# Patient Record
Sex: Male | Born: 1995 | Hispanic: No | Marital: Single | Smoking: Never smoker
Health system: Southern US, Community
[De-identification: ages and names within clinical notes are randomized; demographics above are authoritative.]

## PROBLEM LIST (undated history)

## (undated) DIAGNOSIS — F1910 Other psychoactive substance abuse, uncomplicated: Secondary | ICD-10-CM

## (undated) DIAGNOSIS — F32A Depression, unspecified: Secondary | ICD-10-CM

## (undated) DIAGNOSIS — F329 Major depressive disorder, single episode, unspecified: Secondary | ICD-10-CM

---
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
## 2017-02-04 ENCOUNTER — Encounter (HOSPITAL_COMMUNITY): Payer: Self-pay | Admitting: Nurse Practitioner

## 2017-02-04 DIAGNOSIS — Y93G1 Activity, food preparation and clean up: Secondary | ICD-10-CM | POA: Insufficient documentation

## 2017-02-04 DIAGNOSIS — S61411A Laceration without foreign body of right hand, initial encounter: Secondary | ICD-10-CM | POA: Insufficient documentation

## 2017-02-04 DIAGNOSIS — W260XXA Contact with knife, initial encounter: Secondary | ICD-10-CM | POA: Diagnosis not present

## 2017-02-04 DIAGNOSIS — S6981XA Other specified injuries of right wrist, hand and finger(s), initial encounter: Secondary | ICD-10-CM | POA: Diagnosis present

## 2017-02-04 DIAGNOSIS — Z79899 Other long term (current) drug therapy: Secondary | ICD-10-CM | POA: Insufficient documentation

## 2017-02-04 DIAGNOSIS — Y929 Unspecified place or not applicable: Secondary | ICD-10-CM | POA: Diagnosis not present

## 2017-02-04 DIAGNOSIS — Y999 Unspecified external cause status: Secondary | ICD-10-CM | POA: Insufficient documentation
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
## 2017-02-05 ENCOUNTER — Emergency Department (HOSPITAL_COMMUNITY)
Admission: EM | Admit: 2017-02-05 | Discharge: 2017-02-05 | Disposition: A | Discharge status: Home / Self Care | Payer: Medicaid Other | Attending: Emergency Medicine | Admitting: Emergency Medicine

## 2017-02-05 DIAGNOSIS — S61411A Laceration without foreign body of right hand, initial encounter: Secondary | ICD-10-CM

## 2017-02-05 MED ORDER — BACITRACIN ZINC 500 UNIT/GM EX OINT
TOPICAL_OINTMENT | CUTANEOUS | Status: AC
  Administered 2017-02-05: 1
  Filled 2017-02-05: qty 0.9

## 2017-02-05 MED ORDER — LIDOCAINE-EPINEPHRINE (PF) 2 %-1:200000 IJ SOLN
10.0000 mL | Freq: Once | INTRAMUSCULAR | Status: AC
  Administered 2017-02-05: 10 mL
  Filled 2017-02-05: qty 20

## 2017-02-05 NOTE — ED Provider Notes (Signed)
[facility]-EMERGENCY DEPT Provider Note   CSN: [id number] Arrival date & time: 02/04/17  2120     History   Chief Complaint No chief complaint on file.   HPI Jeremy Short is a 21 y.o. male.  Patient presents to the emergency department with chief complaint of hand laceration.  He states that he was cutting vegetables this evening cut his right hand.  He states that the pain is worsened with palpation.  He denies any difficulty moving his fingers.  He states his tetanus shot is up to date.  He denies any numbness, weakness, or tingling.   The history is provided by the patient. No language interpreter was used.    History reviewed. No pertinent past medical history.  There are no active problems to display for this patient.   History reviewed. No pertinent surgical history.     Home Medications    Prior to Admission medications   Medication Sig Start Date End Date Taking? Authorizing Provider  ALPRAZolam (XANAX PO) Take 1 tablet by mouth once.    [provider]    Family History History reviewed. No pertinent family history.  Social History Social History   Tobacco Use  . Smoking status: Never Smoker  . Smokeless tobacco: Never Used  Substance Use Topics  . Alcohol use: No    Frequency: Never  . Drug use: Not on file     Allergies   Patient has no known allergies.   Review of Systems Review of Systems  All other systems reviewed and are negative.    Physical Exam Updated Vital Signs BP 99/64 (BP Location: Left Arm)   Pulse (!) 119   Temp 97.9 F (36.6 C) (Oral)   Resp 18   SpO2 99%   Physical Exam Nursing note and vitals reviewed.  Constitutional: Pt appears well-developed and well-nourished. No distress.  HENT:  Head: Normocephalic and atraumatic.  Eyes: Conjunctivae are normal.  Neck: Normal range of motion.  Cardiovascular: Normal rate, regular rhythm. Intact distal pulses.   Capillary refill <  3 sec.  Pulmonary/Chest: Effort normal and breath sounds normal.  Musculoskeletal:  Right hand Pt exhibits shallow 2 cm linear laceration to the right hand over the 2nd MCP .   ROM: 5/5  Strength: 5/5  Neurological: Pt  is alert. Coordination normal.  Sensation: 5/5 Skin: Skin is warm and dry. Pt is not diaphoretic.  See in msk Psychiatric: Pt has a normal mood and affect.   \  ED Treatments / Results  Labs (all labs ordered are listed, but only abnormal results are displayed) Labs Reviewed - No data to display  EKG  EKG Interpretation None       Radiology No results found.  Procedures Procedures (including critical care time) LACERATION REPAIR Performed by: Roxy HorsemanBROWNING, Zakyra Kukuk Authorized by: Roxy HorsemanBROWNING, Alaiyah Bollman Consent: Verbal consent obtained. Risks and benefits: risks, benefits and alternatives were discussed Consent given by: patient Patient identity confirmed: provided demographic data Prepped and Draped in normal sterile fashion Wound explored  Laceration Location: right hand  Laceration Length: 2cm  No Foreign Bodies seen or palpated  Anesthesia: local infiltration  Local anesthetic: lidocaine 1% with epinephrine  Anesthetic total: 4 ml  Irrigation method: syringe Amount of cleaning: standard  Skin closure: 4-0 prolene  Number of sutures: 5  Technique: interrupted  Patient tolerance: Patient tolerated the procedure well with no immediate complications.  Medications Ordered in ED Medications  lidocaine-EPINEPHrine (XYLOCAINE W/EPI) 2 %-1:200000 (PF) injection 10 mL (  not administered)     Initial Impression / Assessment and Plan / ED Course  I have reviewed the triage vital signs and the nursing notes.  Pertinent labs & imaging results that were available during my care of the patient were reviewed by me and considered in my medical decision making (see chart for details).     Patient with simple hand laceration.  Tdap is current. PCP  follow-up.  Final Clinical Impressions(s) / ED Diagnoses   Final diagnoses:  Laceration of right hand without foreign body, initial encounter    ED Discharge Orders    None       Roxy HorsemanBrowning, Melisse Caetano, PA-C 02/05/17 0145    Melene PlanFloyd, Dan, DO 02/05/17 [phone]

## 2017-07-01 ENCOUNTER — Encounter (HOSPITAL_COMMUNITY): Payer: Self-pay | Admitting: Nurse Practitioner

## 2017-07-01 ENCOUNTER — Encounter (HOSPITAL_COMMUNITY): Payer: Self-pay | Admitting: *Deleted

## 2017-07-01 ENCOUNTER — Emergency Department (HOSPITAL_COMMUNITY): Payer: Medicaid Other

## 2017-07-01 ENCOUNTER — Observation Stay (HOSPITAL_COMMUNITY)
Admission: EM | Admit: 2017-07-01 | Discharge: 2017-07-01 | Disposition: A | Discharge status: Home / Self Care | Payer: Medicaid Other | Attending: General Surgery | Admitting: General Surgery

## 2017-07-01 ENCOUNTER — Observation Stay (HOSPITAL_COMMUNITY): Payer: Medicaid Other

## 2017-07-01 ENCOUNTER — Other Ambulatory Visit: Payer: Self-pay

## 2017-07-01 DIAGNOSIS — S31139A Puncture wound of abdominal wall without foreign body, unspecified quadrant without penetration into peritoneal cavity, initial encounter: Secondary | ICD-10-CM | POA: Insufficient documentation

## 2017-07-01 DIAGNOSIS — S32009B Unspecified fracture of unspecified lumbar vertebra, initial encounter for open fracture: Secondary | ICD-10-CM | POA: Diagnosis not present

## 2017-07-01 DIAGNOSIS — S61432A Puncture wound without foreign body of left hand, initial encounter: Secondary | ICD-10-CM | POA: Diagnosis not present

## 2017-07-01 DIAGNOSIS — S0183XA Puncture wound without foreign body of other part of head, initial encounter: Secondary | ICD-10-CM | POA: Insufficient documentation

## 2017-07-01 DIAGNOSIS — S71032A Puncture wound without foreign body, left hip, initial encounter: Secondary | ICD-10-CM | POA: Diagnosis not present

## 2017-07-01 DIAGNOSIS — S81032A Puncture wound without foreign body, left knee, initial encounter: Secondary | ICD-10-CM | POA: Insufficient documentation

## 2017-07-01 DIAGNOSIS — W3400XA Accidental discharge from unspecified firearms or gun, initial encounter: Secondary | ICD-10-CM

## 2017-07-01 DIAGNOSIS — S81832A Puncture wound without foreign body, left lower leg, initial encounter: Secondary | ICD-10-CM | POA: Diagnosis present

## 2017-07-01 HISTORY — DX: Depression, unspecified: F32.A

## 2017-07-01 HISTORY — DX: Major depressive disorder, single episode, unspecified: F32.9

## 2017-07-01 LAB — PREPARE FRESH FROZEN PLASMA
Unit division: 0
Unit division: 0

## 2017-07-01 LAB — CBC
HCT: 41.5 % (ref 39.0–52.0)
HEMOGLOBIN: 14 g/dL (ref 13.0–17.0)
MCH: 30.4 pg (ref 26.0–34.0)
MCHC: 33.7 g/dL (ref 30.0–36.0)
MCV: 90 fL (ref 78.0–100.0)
Platelets: 156 10*3/uL (ref 150–400)
RBC: 4.61 MIL/uL (ref 4.22–5.81)
RDW: 12.9 % (ref 11.5–15.5)
WBC: 11.9 10*3/uL — ABNORMAL HIGH (ref 4.0–10.5)

## 2017-07-01 LAB — BPAM RBC
BLOOD PRODUCT EXPIRATION DATE: 201904302359
BLOOD PRODUCT EXPIRATION DATE: 201905042359
ISSUE DATE / TIME: 201904120037
ISSUE DATE / TIME: 201904120037
UNIT TYPE AND RH: 9500
Unit Type and Rh: 9500

## 2017-07-01 LAB — BPAM FFP
Blood Product Expiration Date: 201904122359
Blood Product Expiration Date: 201904132359
ISSUE DATE / TIME: 201904120038
ISSUE DATE / TIME: 201904120038
Unit Type and Rh: 6200
Unit Type and Rh: 6200

## 2017-07-01 LAB — COMPREHENSIVE METABOLIC PANEL
ALK PHOS: 64 U/L (ref 38–126)
ALT: 26 U/L (ref 17–63)
AST: 24 U/L (ref 15–41)
Albumin: 3.9 g/dL (ref 3.5–5.0)
Anion gap: 12 (ref 5–15)
BUN: 11 mg/dL (ref 6–20)
CALCIUM: 8.8 mg/dL — AB (ref 8.9–10.3)
CO2: 21 mmol/L — AB (ref 22–32)
Chloride: 105 mmol/L (ref 101–111)
Creatinine, Ser: 1.01 mg/dL (ref 0.61–1.24)
GFR calc Af Amer: >60 mL/min (ref >60)
GFR calc non Af Amer: >60 mL/min (ref >60)
Glucose, Bld: 108 mg/dL — ABNORMAL HIGH (ref 65–99)
Potassium: 3.2 mmol/L — ABNORMAL LOW (ref 3.5–5.1)
SODIUM: 138 mmol/L (ref 135–145)
Total Bilirubin: 0.5 mg/dL (ref 0.3–1.2)
Total Protein: 6.4 g/dL — ABNORMAL LOW (ref 6.5–8.1)

## 2017-07-01 LAB — TYPE AND SCREEN
ABO/RH(D): O POS
ANTIBODY SCREEN: NEGATIVE
Unit division: 0
Unit division: 0

## 2017-07-01 LAB — I-STAT CHEM 8, ED
BUN: 11 mg/dL (ref 6–20)
CHLORIDE: 104 mmol/L (ref 101–111)
Calcium, Ion: 1.02 mmol/L — ABNORMAL LOW (ref 1.15–1.40)
Creatinine, Ser: 1 mg/dL (ref 0.61–1.24)
Glucose, Bld: 103 mg/dL — ABNORMAL HIGH (ref 65–99)
HCT: 41 % (ref 39.0–52.0)
Hemoglobin: 13.9 g/dL (ref 13.0–17.0)
Potassium: 3.2 mmol/L — ABNORMAL LOW (ref 3.5–5.1)
Sodium: 140 mmol/L (ref 135–145)
TCO2: 24 mmol/L (ref 22–32)

## 2017-07-01 LAB — PROTIME-INR
INR: 1.09
Prothrombin Time: 14 seconds (ref 11.4–15.2)

## 2017-07-01 LAB — CDS SEROLOGY

## 2017-07-01 LAB — ETHANOL: ALCOHOL ETHYL (B): 36 mg/dL — AB (ref <10)

## 2017-07-01 LAB — URINALYSIS, ROUTINE W REFLEX MICROSCOPIC
Bilirubin Urine: NEGATIVE
GLUCOSE, UA: NEGATIVE mg/dL
HGB URINE DIPSTICK: NEGATIVE
Ketones, ur: NEGATIVE mg/dL
Leukocytes, UA: NEGATIVE
Nitrite: NEGATIVE
PROTEIN: NEGATIVE mg/dL
Specific Gravity, Urine: 1.041 — ABNORMAL HIGH (ref 1.005–1.030)
pH: 7 (ref 5.0–8.0)

## 2017-07-01 LAB — BLOOD PRODUCT ORDER (VERBAL) VERIFICATION

## 2017-07-01 LAB — I-STAT CG4 LACTIC ACID, ED: Lactic Acid, Venous: 2.52 mmol/L (ref 0.5–1.9)

## 2017-07-01 LAB — HIV ANTIBODY (ROUTINE TESTING W REFLEX): HIV Screen 4th Generation wRfx: NONREACTIVE

## 2017-07-01 LAB — ABO/RH: ABO/RH(D): O POS

## 2017-07-01 MED ORDER — ACETAMINOPHEN 325 MG PO TABS: 650.0000 mg | ORAL_TABLET | Freq: Four times a day (QID) | ORAL | Status: DC

## 2017-07-01 MED ORDER — BACITRACIN ZINC 500 UNIT/GM EX OINT
TOPICAL_OINTMENT | Freq: Two times a day (BID) | CUTANEOUS | Status: DC
  Administered 2017-07-01: 03:00:00 via TOPICAL
  Filled 2017-07-01: qty 28.35

## 2017-07-01 MED ORDER — ACETAMINOPHEN 325 MG PO TABS: 650.0000 mg | ORAL_TABLET | ORAL | Status: DC | PRN

## 2017-07-01 MED ORDER — ONDANSETRON 4 MG PO TBDP: 4.0000 mg | ORAL_TABLET | Freq: Four times a day (QID) | ORAL | Status: DC | PRN

## 2017-07-01 MED ORDER — METHOCARBAMOL 500 MG PO TABS: 500.0000 mg | ORAL_TABLET | Freq: Three times a day (TID) | ORAL | 0 refills | Status: DC | PRN

## 2017-07-01 MED ORDER — IBUPROFEN 400 MG PO TABS: 400.0000 mg | ORAL_TABLET | Freq: Three times a day (TID) | ORAL | 0 refills | Status: DC

## 2017-07-01 MED ORDER — OXYCODONE HCL 5 MG PO TABS: 5.0000 mg | ORAL_TABLET | ORAL | Status: DC | PRN

## 2017-07-01 MED ORDER — HYDROMORPHONE HCL 1 MG/ML IJ SOLN
1.0000 mg | INTRAMUSCULAR | Status: DC | PRN
Start: 2017-07-01 — End: 2017-07-01
  Administered 2017-07-01 (×2): 1 mg via INTRAVENOUS
  Filled 2017-07-01 (×2): qty 1

## 2017-07-01 MED ORDER — ACETAMINOPHEN 325 MG PO TABS
650.0000 mg | ORAL_TABLET | Freq: Four times a day (QID) | ORAL | Status: DC
  Filled 2017-07-01: qty 2

## 2017-07-01 MED ORDER — NICOTINE 21 MG/24HR TD PT24
21.0000 mg | MEDICATED_PATCH | Freq: Every day | TRANSDERMAL | Status: DC
  Administered 2017-07-01: 21 mg via TRANSDERMAL
  Filled 2017-07-01: qty 1

## 2017-07-01 MED ORDER — OXYCODONE HCL 5 MG PO TABS
10.0000 mg | ORAL_TABLET | ORAL | Status: DC | PRN
  Administered 2017-07-01 (×2): 10 mg via ORAL
  Filled 2017-07-01 (×2): qty 2

## 2017-07-01 MED ORDER — BACITRACIN ZINC 500 UNIT/GM EX OINT: TOPICAL_OINTMENT | Freq: Two times a day (BID) | CUTANEOUS | 0 refills | Status: DC

## 2017-07-01 MED ORDER — METHOCARBAMOL 500 MG PO TABS
500.0000 mg | ORAL_TABLET | Freq: Three times a day (TID) | ORAL | Status: DC | PRN
  Administered 2017-07-01: 500 mg via ORAL
  Filled 2017-07-01: qty 1

## 2017-07-01 MED ORDER — IOPAMIDOL (ISOVUE-300) INJECTION 61%
100.0000 mL | Freq: Once | INTRAVENOUS | Status: AC | PRN
  Administered 2017-07-01: 100 mL via INTRAVENOUS

## 2017-07-01 MED ORDER — IOPAMIDOL (ISOVUE-300) INJECTION 61%
INTRAVENOUS | Status: AC
  Filled 2017-07-01: qty 100

## 2017-07-01 MED ORDER — ENOXAPARIN SODIUM 40 MG/0.4ML ~~LOC~~ SOLN
40.0000 mg | SUBCUTANEOUS | Status: DC
  Administered 2017-07-01: 40 mg via SUBCUTANEOUS
  Filled 2017-07-01: qty 0.4

## 2017-07-01 MED ORDER — OXYCODONE HCL 5 MG PO TABS: 5.0000 mg | ORAL_TABLET | ORAL | 0 refills | Status: DC | PRN

## 2017-07-01 MED ORDER — ONDANSETRON HCL 4 MG/2ML IJ SOLN: 4.0000 mg | Freq: Four times a day (QID) | INTRAMUSCULAR | Status: DC | PRN

## 2017-07-01 MED ORDER — FENTANYL CITRATE (PF) 100 MCG/2ML IJ SOLN
50.0000 ug | Freq: Once | INTRAMUSCULAR | Status: AC
  Administered 2017-07-01: 50 ug via INTRAVENOUS

## 2017-07-01 MED ORDER — KCL IN DEXTROSE-NACL 20-5-0.45 MEQ/L-%-% IV SOLN
INTRAVENOUS | Status: DC
  Administered 2017-07-01: 03:00:00 via INTRAVENOUS
  Filled 2017-07-01: qty 1000

## 2017-07-01 MED ORDER — IBUPROFEN 400 MG PO TABS: 400.0000 mg | ORAL_TABLET | Freq: Three times a day (TID) | ORAL | Status: DC
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
## 2017-07-01 NOTE — Progress Notes (Signed)
Pt for discharge going home, given health teachings, next appt, prescriptions, given pain meds as prescribed prior to discharge, discontinue peripheral IV line, he is asking about the silver with diamond watch in ID but according to our secretary Yolanda nothing in the ED, informed the pt . Given all his personal belongings in the room.

## 2017-07-01 NOTE — ED Notes (Signed)
[employer] at bedside speaking to patient
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
## 2017-07-01 NOTE — ED Notes (Signed)
Password of [phone] provided to patient and father. XXX policy explained to both.
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
## 2019-11-03 ENCOUNTER — Inpatient Hospital Stay (HOSPITAL_COMMUNITY)
Admission: EM | Admit: 2019-11-03 | Discharge: 2019-11-06 | DRG: 896 | Disposition: A | Discharge status: Home / Self Care | Payer: Medicaid Other | Attending: Internal Medicine | Admitting: Internal Medicine

## 2019-11-03 ENCOUNTER — Emergency Department (HOSPITAL_COMMUNITY): Payer: Medicaid Other

## 2019-11-03 ENCOUNTER — Other Ambulatory Visit: Payer: Self-pay

## 2019-11-03 ENCOUNTER — Encounter (HOSPITAL_COMMUNITY): Payer: Self-pay | Admitting: *Deleted

## 2019-11-03 DIAGNOSIS — F329 Major depressive disorder, single episode, unspecified: Secondary | ICD-10-CM | POA: Diagnosis present

## 2019-11-03 DIAGNOSIS — F13230 Sedative, hypnotic or anxiolytic dependence with withdrawal, uncomplicated: Principal | ICD-10-CM | POA: Diagnosis present

## 2019-11-03 DIAGNOSIS — F419 Anxiety disorder, unspecified: Secondary | ICD-10-CM | POA: Diagnosis present

## 2019-11-03 DIAGNOSIS — N179 Acute kidney failure, unspecified: Secondary | ICD-10-CM | POA: Diagnosis present

## 2019-11-03 DIAGNOSIS — Z79899 Other long term (current) drug therapy: Secondary | ICD-10-CM

## 2019-11-03 DIAGNOSIS — R739 Hyperglycemia, unspecified: Secondary | ICD-10-CM | POA: Diagnosis present

## 2019-11-03 DIAGNOSIS — E872 Acidosis, unspecified: Secondary | ICD-10-CM | POA: Diagnosis present

## 2019-11-03 DIAGNOSIS — D649 Anemia, unspecified: Secondary | ICD-10-CM | POA: Diagnosis present

## 2019-11-03 DIAGNOSIS — R509 Fever, unspecified: Secondary | ICD-10-CM | POA: Diagnosis not present

## 2019-11-03 DIAGNOSIS — T424X6A Underdosing of benzodiazepines, initial encounter: Secondary | ICD-10-CM | POA: Diagnosis present

## 2019-11-03 DIAGNOSIS — G928 Other toxic encephalopathy: Secondary | ICD-10-CM | POA: Diagnosis present

## 2019-11-03 DIAGNOSIS — D72829 Elevated white blood cell count, unspecified: Secondary | ICD-10-CM | POA: Diagnosis present

## 2019-11-03 DIAGNOSIS — R079 Chest pain, unspecified: Secondary | ICD-10-CM

## 2019-11-03 DIAGNOSIS — R569 Unspecified convulsions: Secondary | ICD-10-CM

## 2019-11-03 DIAGNOSIS — Z91138 Patient's unintentional underdosing of medication regimen for other reason: Secondary | ICD-10-CM

## 2019-11-03 DIAGNOSIS — E8809 Other disorders of plasma-protein metabolism, not elsewhere classified: Secondary | ICD-10-CM | POA: Diagnosis not present

## 2019-11-03 DIAGNOSIS — G4089 Other seizures: Secondary | ICD-10-CM | POA: Diagnosis present

## 2019-11-03 DIAGNOSIS — Z20822 Contact with and (suspected) exposure to covid-19: Secondary | ICD-10-CM | POA: Diagnosis present

## 2019-11-03 DIAGNOSIS — G92 Toxic encephalopathy: Secondary | ICD-10-CM | POA: Diagnosis present

## 2019-11-03 DIAGNOSIS — E778 Other disorders of glycoprotein metabolism: Secondary | ICD-10-CM | POA: Diagnosis not present

## 2019-11-03 LAB — I-STAT VENOUS BLOOD GAS, ED
Acid-base deficit: 26 mmol/L — ABNORMAL HIGH (ref 0.0–2.0)
Bicarbonate: 4.5 mmol/L — ABNORMAL LOW (ref 20.0–28.0)
Calcium, Ion: 1.12 mmol/L — ABNORMAL LOW (ref 1.15–1.40)
HCT: 47 % (ref 39.0–52.0)
Hemoglobin: 16 g/dL (ref 13.0–17.0)
O2 Saturation: 97 %
Potassium: 4.3 mmol/L (ref 3.5–5.1)
Sodium: 142 mmol/L (ref 135–145)
TCO2: 5 mmol/L — ABNORMAL LOW (ref 22–32)
pCO2, Ven: 19 mmHg — CL (ref 44.0–60.0)
pH, Ven: 6.986 — CL (ref 7.250–7.430)
pO2, Ven: 129 mmHg — ABNORMAL HIGH (ref 32.0–45.0)

## 2019-11-03 LAB — PROTIME-INR
INR: 1.4 — ABNORMAL HIGH (ref 0.8–1.2)
Prothrombin Time: 17 seconds — ABNORMAL HIGH (ref 11.4–15.2)

## 2019-11-03 LAB — I-STAT CHEM 8, ED
BUN: 26 mg/dL — ABNORMAL HIGH (ref 6–20)
Calcium, Ion: 1.14 mmol/L — ABNORMAL LOW (ref 1.15–1.40)
Chloride: 109 mmol/L (ref 98–111)
Creatinine, Ser: 1.5 mg/dL — ABNORMAL HIGH (ref 0.61–1.24)
Glucose, Bld: 244 mg/dL — ABNORMAL HIGH (ref 70–99)
HCT: 47 % (ref 39.0–52.0)
Hemoglobin: 16 g/dL (ref 13.0–17.0)
Potassium: 4.3 mmol/L (ref 3.5–5.1)
Sodium: 143 mmol/L (ref 135–145)
TCO2: 6 mmol/L — ABNORMAL LOW (ref 22–32)

## 2019-11-03 MED ORDER — SODIUM BICARBONATE 8.4 % IV SOLN
50.0000 meq | Freq: Once | INTRAVENOUS | Status: AC
  Administered 2019-11-03: 50 meq via INTRAVENOUS
  Filled 2019-11-03: qty 50

## 2019-11-03 MED ORDER — STERILE WATER FOR INJECTION IV SOLN
INTRAVENOUS | Status: DC
  Filled 2019-11-03: qty 850

## 2019-11-03 MED ORDER — DEXAMETHASONE SODIUM PHOSPHATE 10 MG/ML IJ SOLN
10.0000 mg | Freq: Once | INTRAMUSCULAR | Status: AC
  Administered 2019-11-04: 10 mg via INTRAVENOUS
  Filled 2019-11-03: qty 1

## 2019-11-03 MED ORDER — VANCOMYCIN HCL IN DEXTROSE 1-5 GM/200ML-% IV SOLN
1000.0000 mg | Freq: Once | INTRAVENOUS | Status: AC
  Administered 2019-11-04: 1000 mg via INTRAVENOUS
  Filled 2019-11-03: qty 200

## 2019-11-03 MED ORDER — SODIUM CHLORIDE 0.9 % IV SOLN
2.0000 g | Freq: Once | INTRAVENOUS | Status: AC
  Administered 2019-11-04: 2 g via INTRAVENOUS
  Filled 2019-11-03: qty 20

## 2019-11-03 MED ORDER — LACTATED RINGERS IV BOLUS (SEPSIS)
1000.0000 mL | Freq: Once | INTRAVENOUS | Status: AC
  Administered 2019-11-04: 1000 mL via INTRAVENOUS

## 2019-11-03 MED ORDER — SODIUM CHLORIDE 0.9 % IV BOLUS
1000.0000 mL | Freq: Once | INTRAVENOUS | Status: AC
  Administered 2019-11-04: 1000 mL via INTRAVENOUS

## 2019-11-03 NOTE — ED Notes (Signed)
Dr. Madilyn Hook made aware of Chem 8 and VBG results.
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
## 2019-11-04 ENCOUNTER — Observation Stay (HOSPITAL_COMMUNITY): Payer: Medicaid Other

## 2019-11-04 ENCOUNTER — Encounter (HOSPITAL_COMMUNITY): Payer: Self-pay | Admitting: Internal Medicine

## 2019-11-04 DIAGNOSIS — G92 Toxic encephalopathy: Secondary | ICD-10-CM | POA: Diagnosis present

## 2019-11-04 DIAGNOSIS — Z91138 Patient's unintentional underdosing of medication regimen for other reason: Secondary | ICD-10-CM | POA: Diagnosis not present

## 2019-11-04 DIAGNOSIS — D649 Anemia, unspecified: Secondary | ICD-10-CM | POA: Diagnosis present

## 2019-11-04 DIAGNOSIS — E872 Acidosis, unspecified: Secondary | ICD-10-CM | POA: Diagnosis present

## 2019-11-04 DIAGNOSIS — Z20822 Contact with and (suspected) exposure to covid-19: Secondary | ICD-10-CM | POA: Diagnosis present

## 2019-11-04 DIAGNOSIS — T424X6A Underdosing of benzodiazepines, initial encounter: Secondary | ICD-10-CM | POA: Diagnosis present

## 2019-11-04 DIAGNOSIS — F419 Anxiety disorder, unspecified: Secondary | ICD-10-CM | POA: Diagnosis present

## 2019-11-04 DIAGNOSIS — E8809 Other disorders of plasma-protein metabolism, not elsewhere classified: Secondary | ICD-10-CM | POA: Diagnosis not present

## 2019-11-04 DIAGNOSIS — R569 Unspecified convulsions: Secondary | ICD-10-CM | POA: Diagnosis present

## 2019-11-04 DIAGNOSIS — G4089 Other seizures: Secondary | ICD-10-CM | POA: Diagnosis present

## 2019-11-04 DIAGNOSIS — N179 Acute kidney failure, unspecified: Secondary | ICD-10-CM | POA: Diagnosis present

## 2019-11-04 DIAGNOSIS — R739 Hyperglycemia, unspecified: Secondary | ICD-10-CM | POA: Diagnosis present

## 2019-11-04 DIAGNOSIS — D72829 Elevated white blood cell count, unspecified: Secondary | ICD-10-CM | POA: Diagnosis present

## 2019-11-04 DIAGNOSIS — Z79899 Other long term (current) drug therapy: Secondary | ICD-10-CM | POA: Diagnosis not present

## 2019-11-04 DIAGNOSIS — R509 Fever, unspecified: Secondary | ICD-10-CM | POA: Diagnosis not present

## 2019-11-04 DIAGNOSIS — G928 Other toxic encephalopathy: Secondary | ICD-10-CM | POA: Diagnosis present

## 2019-11-04 DIAGNOSIS — F329 Major depressive disorder, single episode, unspecified: Secondary | ICD-10-CM | POA: Diagnosis present

## 2019-11-04 DIAGNOSIS — E778 Other disorders of glycoprotein metabolism: Secondary | ICD-10-CM | POA: Diagnosis not present

## 2019-11-04 DIAGNOSIS — F13230 Sedative, hypnotic or anxiolytic dependence with withdrawal, uncomplicated: Secondary | ICD-10-CM | POA: Diagnosis present

## 2019-11-04 LAB — CBC WITH DIFFERENTIAL/PLATELET
Abs Immature Granulocytes: 0.5 10*3/uL — ABNORMAL HIGH (ref 0.00–0.07)
Abs Immature Granulocytes: 0.11 10*3/uL — ABNORMAL HIGH (ref 0.00–0.07)
Basophils Absolute: 0.1 10*3/uL (ref 0.0–0.1)
Basophils Absolute: 0 10*3/uL (ref 0.0–0.1)
Basophils Relative: 0 %
Basophils Relative: 0 %
Eosinophils Absolute: 0.1 10*3/uL (ref 0.0–0.5)
Eosinophils Absolute: 0 10*3/uL (ref 0.0–0.5)
Eosinophils Relative: 0 %
Eosinophils Relative: 0 %
HCT: 49.5 % (ref 39.0–52.0)
HCT: 36.8 % — ABNORMAL LOW (ref 39.0–52.0)
Hemoglobin: 15.2 g/dL (ref 13.0–17.0)
Hemoglobin: 12.5 g/dL — ABNORMAL LOW (ref 13.0–17.0)
Immature Granulocytes: 2 %
Immature Granulocytes: 1 %
Lymphocytes Relative: 11 %
Lymphocytes Relative: 7 %
Lymphs Abs: 2.9 10*3/uL (ref 0.7–4.0)
Lymphs Abs: 1.4 10*3/uL (ref 0.7–4.0)
MCH: 29 pg (ref 26.0–34.0)
MCH: 28.6 pg (ref 26.0–34.0)
MCHC: 30.7 g/dL (ref 30.0–36.0)
MCHC: 34 g/dL (ref 30.0–36.0)
MCV: 94.3 fL (ref 80.0–100.0)
MCV: 84.2 fL (ref 80.0–100.0)
Monocytes Absolute: 1.6 10*3/uL — ABNORMAL HIGH (ref 0.1–1.0)
Monocytes Absolute: 1.4 10*3/uL — ABNORMAL HIGH (ref 0.1–1.0)
Monocytes Relative: 6 %
Monocytes Relative: 7 %
Neutro Abs: 20.3 10*3/uL — ABNORMAL HIGH (ref 1.7–7.7)
Neutro Abs: 15.7 10*3/uL — ABNORMAL HIGH (ref 1.7–7.7)
Neutrophils Relative %: 81 %
Neutrophils Relative %: 85 %
Platelets: 372 10*3/uL (ref 150–400)
Platelets: 252 10*3/uL (ref 150–400)
RBC: 5.25 MIL/uL (ref 4.22–5.81)
RBC: 4.37 MIL/uL (ref 4.22–5.81)
RDW: 11.7 % (ref 11.5–15.5)
RDW: 11.9 % (ref 11.5–15.5)
WBC: 25.3 10*3/uL — ABNORMAL HIGH (ref 4.0–10.5)
WBC: 18.6 10*3/uL — ABNORMAL HIGH (ref 4.0–10.5)
nRBC: 0 % (ref 0.0–0.2)
nRBC: 0 % (ref 0.0–0.2)

## 2019-11-04 LAB — CSF CELL COUNT WITH DIFFERENTIAL
RBC Count, CSF: 1 /mm3 — ABNORMAL HIGH
RBC Count, CSF: 4 /mm3 — ABNORMAL HIGH
Tube #: 1
Tube #: 4
WBC, CSF: 2 /mm3 (ref 0–5)
WBC, CSF: 3 /mm3 (ref 0–5)

## 2019-11-04 LAB — COMPREHENSIVE METABOLIC PANEL
ALT: 19 U/L (ref 0–44)
ALT: 17 U/L (ref 0–44)
AST: 30 U/L (ref 15–41)
AST: 31 U/L (ref 15–41)
Albumin: 4.6 g/dL (ref 3.5–5.0)
Albumin: 3.8 g/dL (ref 3.5–5.0)
Alkaline Phosphatase: 89 U/L (ref 38–126)
Alkaline Phosphatase: 76 U/L (ref 38–126)
Anion gap: 12 (ref 5–15)
BUN: 25 mg/dL — ABNORMAL HIGH (ref 6–20)
BUN: 22 mg/dL — ABNORMAL HIGH (ref 6–20)
CO2: <7 mmol/L — ABNORMAL LOW (ref 22–32)
CO2: 22 mmol/L (ref 22–32)
Calcium: 9.9 mg/dL (ref 8.9–10.3)
Calcium: 8.6 mg/dL — ABNORMAL LOW (ref 8.9–10.3)
Chloride: 103 mmol/L (ref 98–111)
Chloride: 109 mmol/L (ref 98–111)
Creatinine, Ser: 1.83 mg/dL — ABNORMAL HIGH (ref 0.61–1.24)
Creatinine, Ser: 1.61 mg/dL — ABNORMAL HIGH (ref 0.61–1.24)
GFR calc Af Amer: 60 mL/min — ABNORMAL LOW (ref >60)
GFR calc Af Amer: >60 mL/min (ref >60)
GFR calc non Af Amer: 52 mL/min — ABNORMAL LOW (ref >60)
GFR calc non Af Amer: >60 mL/min (ref >60)
Glucose, Bld: 260 mg/dL — ABNORMAL HIGH (ref 70–99)
Glucose, Bld: 121 mg/dL — ABNORMAL HIGH (ref 70–99)
Potassium: 4.6 mmol/L (ref 3.5–5.1)
Potassium: 3.9 mmol/L (ref 3.5–5.1)
Sodium: 143 mmol/L (ref 135–145)
Sodium: 143 mmol/L (ref 135–145)
Total Bilirubin: 0.6 mg/dL (ref 0.3–1.2)
Total Bilirubin: 0.9 mg/dL (ref 0.3–1.2)
Total Protein: 8 g/dL (ref 6.5–8.1)
Total Protein: 6.3 g/dL — ABNORMAL LOW (ref 6.5–8.1)

## 2019-11-04 LAB — HEMOGLOBIN A1C
Hgb A1c MFr Bld: 5.3 % (ref 4.8–5.6)
Mean Plasma Glucose: 105.41 mg/dL

## 2019-11-04 LAB — I-STAT VENOUS BLOOD GAS, ED
Acid-base deficit: 2 mmol/L (ref 0.0–2.0)
Bicarbonate: 22 mmol/L (ref 20.0–28.0)
Calcium, Ion: 1.04 mmol/L — ABNORMAL LOW (ref 1.15–1.40)
HCT: 35 % — ABNORMAL LOW (ref 39.0–52.0)
Hemoglobin: 11.9 g/dL — ABNORMAL LOW (ref 13.0–17.0)
O2 Saturation: 99 %
Potassium: 3.3 mmol/L — ABNORMAL LOW (ref 3.5–5.1)
Sodium: 144 mmol/L (ref 135–145)
TCO2: 23 mmol/L (ref 22–32)
pCO2, Ven: 34.9 mmHg — ABNORMAL LOW (ref 44.0–60.0)
pH, Ven: 7.408 (ref 7.250–7.430)
pO2, Ven: 121 mmHg — ABNORMAL HIGH (ref 32.0–45.0)

## 2019-11-04 LAB — URINALYSIS, ROUTINE W REFLEX MICROSCOPIC
Bilirubin Urine: NEGATIVE
Glucose, UA: NEGATIVE mg/dL
Hgb urine dipstick: NEGATIVE
Ketones, ur: 5 mg/dL — AB
Leukocytes,Ua: NEGATIVE
Nitrite: NEGATIVE
Protein, ur: NEGATIVE mg/dL
Specific Gravity, Urine: 1.019 (ref 1.005–1.030)
pH: 6 (ref 5.0–8.0)

## 2019-11-04 LAB — RAPID URINE DRUG SCREEN, HOSP PERFORMED
Amphetamines: NOT DETECTED
Barbiturates: NOT DETECTED
Benzodiazepines: POSITIVE — AB
Cocaine: NOT DETECTED
Opiates: NOT DETECTED
Tetrahydrocannabinol: POSITIVE — AB

## 2019-11-04 LAB — PROTEIN AND GLUCOSE, CSF
Glucose, CSF: 89 mg/dL — ABNORMAL HIGH (ref 40–70)
Total  Protein, CSF: 34 mg/dL (ref 15–45)

## 2019-11-04 LAB — LACTIC ACID, PLASMA
Lactic Acid, Venous: >11 mmol/L (ref 0.5–1.9)
Lactic Acid, Venous: 2.9 mmol/L (ref 0.5–1.9)

## 2019-11-04 LAB — SARS CORONAVIRUS 2 BY RT PCR (HOSPITAL ORDER, PERFORMED IN ~~LOC~~ HOSPITAL LAB): SARS Coronavirus 2: NEGATIVE

## 2019-11-04 LAB — MAGNESIUM: Magnesium: 3 mg/dL — ABNORMAL HIGH (ref 1.7–2.4)

## 2019-11-04 LAB — HIV ANTIBODY (ROUTINE TESTING W REFLEX): HIV Screen 4th Generation wRfx: NONREACTIVE

## 2019-11-04 LAB — VITAMIN B12: Vitamin B-12: 412 pg/mL (ref 180–914)

## 2019-11-04 LAB — AMMONIA: Ammonia: 20 umol/L (ref 9–35)

## 2019-11-04 LAB — FOLATE: Folate: 17 ng/mL (ref >5.9)

## 2019-11-04 LAB — PROTIME-INR
INR: 1.3 — ABNORMAL HIGH (ref 0.8–1.2)
Prothrombin Time: 15.3 seconds — ABNORMAL HIGH (ref 11.4–15.2)

## 2019-11-04 LAB — RPR: RPR Ser Ql: NONREACTIVE

## 2019-11-04 LAB — APTT: aPTT: 28 seconds (ref 24–36)

## 2019-11-04 LAB — TSH: TSH: 0.518 u[IU]/mL (ref 0.350–4.500)

## 2019-11-04 MED ORDER — SODIUM CHLORIDE 0.9 % IV BOLUS: 500.0000 mL | Freq: Three times a day (TID) | INTRAVENOUS | Status: DC

## 2019-11-04 MED ORDER — ONDANSETRON HCL 4 MG PO TABS: 4.0000 mg | ORAL_TABLET | Freq: Four times a day (QID) | ORAL | Status: DC | PRN

## 2019-11-04 MED ORDER — IOHEXOL 300 MG/ML  SOLN
75.0000 mL | Freq: Once | INTRAMUSCULAR | Status: AC | PRN
  Administered 2019-11-04: 75 mL via INTRAVENOUS

## 2019-11-04 MED ORDER — DEXTROSE 5 % IV SOLN
10.0000 mg/kg | Freq: Three times a day (TID) | INTRAVENOUS | Status: DC
  Administered 2019-11-04 – 2019-11-06 (×6): 725 mg via INTRAVENOUS
  Filled 2019-11-04 (×8): qty 14.5

## 2019-11-04 MED ORDER — LIDOCAINE HCL (PF) 1 % IJ SOLN
5.0000 mL | Freq: Once | INTRAMUSCULAR | Status: AC
  Administered 2019-11-04: 5 mL via INTRADERMAL

## 2019-11-04 MED ORDER — LORAZEPAM 2 MG/ML IJ SOLN: 2.0000 mg | Freq: Four times a day (QID) | INTRAMUSCULAR | Status: DC | PRN

## 2019-11-04 MED ORDER — POLYETHYLENE GLYCOL 3350 17 G PO PACK: 17.0000 g | PACK | Freq: Every day | ORAL | Status: DC | PRN

## 2019-11-04 MED ORDER — LIDOCAINE HCL (PF) 1 % IJ SOLN
INTRAMUSCULAR | Status: AC
  Filled 2019-11-04: qty 5

## 2019-11-04 MED ORDER — ENOXAPARIN SODIUM 40 MG/0.4ML ~~LOC~~ SOLN
40.0000 mg | SUBCUTANEOUS | Status: DC
  Administered 2019-11-04 – 2019-11-06 (×3): 40 mg via SUBCUTANEOUS
  Filled 2019-11-04 (×3): qty 0.4

## 2019-11-04 MED ORDER — ACETAMINOPHEN 650 MG RE SUPP
650.0000 mg | Freq: Four times a day (QID) | RECTAL | Status: DC | PRN
  Administered 2019-11-04 – 2019-11-05 (×3): 650 mg via RECTAL
  Filled 2019-11-04 (×3): qty 1

## 2019-11-04 MED ORDER — SODIUM CHLORIDE 0.9 % IV BOLUS
500.0000 mL | Freq: Three times a day (TID) | INTRAVENOUS | Status: DC
  Administered 2019-11-04 – 2019-11-05 (×4): 500 mL via INTRAVENOUS

## 2019-11-04 MED ORDER — LEVETIRACETAM IN NACL 1000 MG/100ML IV SOLN
1000.0000 mg | Freq: Once | INTRAVENOUS | Status: AC
  Administered 2019-11-04: 1000 mg via INTRAVENOUS
  Filled 2019-11-04: qty 100

## 2019-11-04 MED ORDER — ONDANSETRON HCL 4 MG/2ML IJ SOLN
4.0000 mg | Freq: Four times a day (QID) | INTRAMUSCULAR | Status: DC | PRN
  Administered 2019-11-04: 4 mg via INTRAVENOUS
  Filled 2019-11-04: qty 2

## 2019-11-04 MED ORDER — ACETAMINOPHEN 325 MG PO TABS: 650.0000 mg | ORAL_TABLET | Freq: Four times a day (QID) | ORAL | Status: DC | PRN

## 2019-11-04 MED ORDER — LACTATED RINGERS IV SOLN: INTRAVENOUS | Status: DC

## 2019-11-04 NOTE — ED Notes (Signed)
Admitting doctor at  The bedside 

## 2019-11-04 NOTE — ED Notes (Signed)
Pt lying with both eyes closed getting a 24 hour eeg

## 2019-11-04 NOTE — Procedures (Signed)
Patient Name: Taiwan Talcott  MRN: [id number]  Epilepsy Attending: Charlsie Quest  Referring Physician/Provider: Dr Shauna Hugh Date: 11/04/2019 Duration: 24.44 mins  Patient history: 24 year old male with new onset seizure. EEG to evaluate for seizure.  Level of alertness: Awake  AEDs during EEG study: None  Technical aspects: This EEG study was done with scalp electrodes positioned according to the 10-20 International system of electrode placement. Electrical activity was acquired at a sampling rate of 500Hz  and reviewed with a high frequency filter of 70Hz  and a low frequency filter of 1Hz . EEG data were recorded continuously and digitally stored.   Description: The posterior dominant rhythm consists of 6.5-7Hz  activity of moderate voltage (25-35 uV) seen predominantly in posterior head regions, symmetric and reactive to eye opening and eye closing. EEG showed continuous generalized 6-7Hz  theta slowing. Hyperventilation and photic stimulation were not performed.     ABNORMALITY -Continuous slow, generalized  IMPRESSION: This study is suggestive of mild to moderate diffuse encephalopathy, nonspecific etiology. No seizures or epileptiform discharges were seen throughout the recording.  Arren Laminack 

## 2019-11-04 NOTE — ED Triage Notes (Signed)
The pt arrived by gems from home  The pt has been ill for the past 2-3 days  He was at home from [place]  His family came home and found the pt not responsive and breathing fast  Ems arrived with him and reports some type seizure activity ??  They gave him versed 2 mg iv on the way here  The pt having rapid respirations approx 40 times per minute dr Ralene Bathe met the pt in the hallway  Unable to room the pt  No rooms  Available iv per ems family on the way  Father arrived out front

## 2019-11-04 NOTE — ED Notes (Signed)
eeg still running
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
## 2019-11-04 NOTE — Progress Notes (Signed)
LTM EEG hooked up and running - no initial skin breakdown - push button tested - neuro notified.Same leads used.[facility] to monitor

## 2019-11-04 NOTE — Progress Notes (Signed)
Patient arrived to unit, VSS, NAD noted. EEG intact.
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
## 2019-11-04 NOTE — ED Notes (Signed)
Sl confused still

## 2019-11-04 NOTE — ED Notes (Signed)
Update given to pt's father

## 2019-11-04 NOTE — ED Provider Notes (Signed)
Patient was seen by Dr. Madilyn Hook.  Patient was being admitted for altered mental status and seizures.  Dr Iver Nestle , neurology has seen the patient and recommends an LP.  I have discussed with the patient however he is confused.  Will proceed emergently. Physical Exam  BP 127/76    Pulse (!) 52    Temp (!) 101.3 F (38.5 C) (Oral)    Resp (!) 24    Ht 1.854 m (6\' 1" )    Wt 72.6 kg    SpO2 98%    BMI 21.12 kg/m   Physical Exam Alert, cofused Back: no signs of infection or swelling ED Course/Procedures     .Lumbar Puncture  Date/Time: 11/04/2019 9:38 AM Performed by: 11/06/2019, MD Authorized by: Linwood Dibbles, MD   Consent:    Consent obtained:  Emergent situation Pre-procedure details:    Procedure purpose:  Diagnostic   Preparation: Patient was prepped and draped in usual sterile fashion   Anesthesia (see MAR for exact dosages):    Anesthesia method:  Local infiltration   Local anesthetic:  Procaine 1% w/o epi Procedure details:    Lumbar space:  L4-L5 interspace   Patient position:  L lateral decubitus   Needle gauge:  22   Needle type:  Spinal needle - Quincke tip   Needle length (in):  3.5   Ultrasound guidance: no     Number of attempts:  1   Opening pressure (cm H2O):  12   Fluid appearance:  Clear   Tubes of fluid:  4   Total volume (ml):  6 Post-procedure:    Puncture site:  Adhesive bandage applied   Patient tolerance of procedure:  Tolerated well, no immediate complications Comments:     Pt confused.  Frequently was moving during the procedure while fluid was being collected.  Required constant re instruction.  3 people were holding him the entire time during the procedure.  Fluid remained clear.    MDM  LP performed, first attempt.  Difficult to collect fluid as pt was constantly moving but able to collect 6 cc, 4 tubes.       Linwood Dibbles, MD 11/04/19 [phone]

## 2019-11-04 NOTE — ED Notes (Addendum)
No verbal  Both eyes open now   Father at  The bedside asking about food for his son  Npo at present  Except for meds

## 2019-11-04 NOTE — ED Notes (Signed)
Report given to rn on 3  w 
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
## 2019-11-04 NOTE — ED Notes (Signed)
EEG at bedside.

## 2019-11-04 NOTE — ED Notes (Signed)
Father at   The bedside  The pt is more lucid at present
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
## 2019-11-04 NOTE — Progress Notes (Signed)
STAT EEG complete - results pending. ? ?

## 2019-11-05 DIAGNOSIS — N179 Acute kidney failure, unspecified: Secondary | ICD-10-CM | POA: Diagnosis not present

## 2019-11-05 DIAGNOSIS — R569 Unspecified convulsions: Secondary | ICD-10-CM | POA: Diagnosis not present

## 2019-11-05 LAB — CBC WITH DIFFERENTIAL/PLATELET
Abs Immature Granulocytes: 0.06 10*3/uL (ref 0.00–0.07)
Basophils Absolute: 0 10*3/uL (ref 0.0–0.1)
Basophils Relative: 0 %
Eosinophils Absolute: 0 10*3/uL (ref 0.0–0.5)
Eosinophils Relative: 0 %
HCT: 34.4 % — ABNORMAL LOW (ref 39.0–52.0)
Hemoglobin: 11.6 g/dL — ABNORMAL LOW (ref 13.0–17.0)
Immature Granulocytes: 1 %
Lymphocytes Relative: 14 %
Lymphs Abs: 1.7 10*3/uL (ref 0.7–4.0)
MCH: 29.2 pg (ref 26.0–34.0)
MCHC: 33.7 g/dL (ref 30.0–36.0)
MCV: 86.6 fL (ref 80.0–100.0)
Monocytes Absolute: 1.2 10*3/uL — ABNORMAL HIGH (ref 0.1–1.0)
Monocytes Relative: 9 %
Neutro Abs: 9.8 10*3/uL — ABNORMAL HIGH (ref 1.7–7.7)
Neutrophils Relative %: 76 %
Platelets: 188 10*3/uL (ref 150–400)
RBC: 3.97 MIL/uL — ABNORMAL LOW (ref 4.22–5.81)
RDW: 12.1 % (ref 11.5–15.5)
WBC: 12.8 10*3/uL — ABNORMAL HIGH (ref 4.0–10.5)
nRBC: 0 % (ref 0.0–0.2)

## 2019-11-05 LAB — GLUCOSE, CAPILLARY
Glucose-Capillary: 106 mg/dL — ABNORMAL HIGH (ref 70–99)
Glucose-Capillary: 102 mg/dL — ABNORMAL HIGH (ref 70–99)
Glucose-Capillary: 94 mg/dL (ref 70–99)

## 2019-11-05 LAB — URINE CULTURE: Culture: NO GROWTH

## 2019-11-05 LAB — COMPREHENSIVE METABOLIC PANEL
ALT: 18 U/L (ref 0–44)
AST: 33 U/L (ref 15–41)
Albumin: 3.3 g/dL — ABNORMAL LOW (ref 3.5–5.0)
Alkaline Phosphatase: 62 U/L (ref 38–126)
Anion gap: 10 (ref 5–15)
BUN: 14 mg/dL (ref 6–20)
CO2: 23 mmol/L (ref 22–32)
Calcium: 8.2 mg/dL — ABNORMAL LOW (ref 8.9–10.3)
Chloride: 111 mmol/L (ref 98–111)
Creatinine, Ser: 1.3 mg/dL — ABNORMAL HIGH (ref 0.61–1.24)
GFR calc Af Amer: >60 mL/min (ref >60)
GFR calc non Af Amer: >60 mL/min (ref >60)
Glucose, Bld: 111 mg/dL — ABNORMAL HIGH (ref 70–99)
Potassium: 3.6 mmol/L (ref 3.5–5.1)
Sodium: 144 mmol/L (ref 135–145)
Total Bilirubin: 0.8 mg/dL (ref 0.3–1.2)
Total Protein: 5.7 g/dL — ABNORMAL LOW (ref 6.5–8.1)

## 2019-11-05 LAB — MAGNESIUM: Magnesium: 2.2 mg/dL (ref 1.7–2.4)

## 2019-11-05 MED ORDER — ALPRAZOLAM 0.5 MG PO TABS
0.5000 mg | ORAL_TABLET | Freq: Two times a day (BID) | ORAL | Status: DC
  Administered 2019-11-05 – 2019-11-06 (×3): 0.5 mg via ORAL
  Filled 2019-11-05 (×3): qty 1

## 2019-11-05 NOTE — Progress Notes (Signed)
Subjective: No further seizure-like episodes overnight.  Patient's father at bedside.  Father states patient was not feeling well and not eating for few days prior to admission.  On the day of admission,  family went outside, patient was alone at home and when they came back they found him on the couch laying, not responding much with some froth coming out of his mouth.  Patient was noted to have seizure-like activity en route to the hospital.  ROS: negative except above  Examination  Vital signs in last 24 hours: Temp:  [98.9 F (37.2 C)-101.3 F (38.5 C)] 99 F (37.2 C) (08/16 0414) Pulse Rate:  [48-66] 54 (08/16 0444) Resp:  [9-30] 9 (08/16 0444) BP: (119-142)/(70-89) 137/83 (08/16 0444) SpO2:  [90 %-100 %] 95 % (08/16 0444)  General: lying in bed, not in apparent distress CVS: pulse-normal rate and rhythm RS: breathing comfortably Extremities: normal, warm Psych: Patient quiet, only answers questions after asking repeatedly, does not make eye contact Neuro: AOx3, cranial nerves II to XII grossly intact, antigravity strength in all 4 extremities   Basic Metabolic Panel: Recent Labs  Lab 11/03/19 2328 11/03/19 2338 11/04/19 0315 11/04/19 0737 11/05/19 0238  NA 143 142  143 144 143 144  K 4.6 4.3  4.3 3.3* 3.9 3.6  CL 103 109  --  109 111  CO2 <7*  --   --  22 23  GLUCOSE 260* 244*  --  121* 111*  BUN 25* 26*  --  22* 14  CREATININE 1.83* 1.50*  --  1.61* 1.30*  CALCIUM 9.9  --   --  8.6* 8.2*  MG  --   --   --  3.0* 2.2    CBC: Recent Labs  Lab 11/03/19 2328 11/03/19 2338 11/04/19 0315 11/04/19 0834 11/05/19 0238  WBC 25.3*  --   --  18.6* 12.8*  NEUTROABS 20.3*  --   --  15.7* 9.8*  HGB 15.2 16.0  16.0 11.9* 12.5* 11.6*  HCT 49.5 47.0  47.0 35.0* 36.8* 34.4*  MCV 94.3  --   --  84.2 86.6  PLT 372  --   --  252 188     Coagulation Studies: Recent Labs    11/03/19 2328 11/04/19 0732  LABPROT 17.0* 15.3*  INR 1.4* 1.3*    Imaging CT head w  contrast 11/04/2019: Negative head CT with contrast.  No abnormal enhancement  ASSESSMENT AND PLAN  New onset seizure Fever Leucocytosis Anemia AKI Hypoproteinemia with Hypoalbuminemia Hypocalcemia - LTM eeg: no seizures overnight.  Suspected seizures in setting of possible Xanax withdrawal - CSF with no pleocytosis, normal protein, glucose elevated at 89, HSV pending -No clear source of infection at this point.  Fever is improving.  Recommendations -Resume home Xanax dose -We will discontinue LTM EEG as patient has not had any further seizures -Does not need to any antiepileptics as this could have been a provoked seizure in setting of possible Xanax withdrawal -Continue acyclovir to HSV PCR is back however I highly doubt that patient has herpes at this point -Continue management of rest of the comorbidities per primary team -Discussed diagnosis and management with patient and patient's father at bedside in detail -We will need seizure precautions including do not drive at the time of discharge -Follow-up with neurology in 6 months  Seizure precautions: Per [facility] statutes, patients with seizures are not allowed to drive until they have been seizure-free for six months and cleared by a physician  Use caution when using heavy equipment or power tools. Avoid working on ladders or at heights. Take showers instead of baths. Ensure the water temperature is not too high on the home water heater. Do not go swimming alone. Do not lock yourself in a room alone (i.e. bathroom). When caring for infants or small children, sit down when holding, feeding, or changing them to minimize risk of injury to the child in the event you have a seizure. Maintain good sleep hygiene. Avoid alcohol.    If patient has another seizure, call 911 and bring them back to the ED if: A.  The seizure lasts longer than 5 minutes.      B.  The patient doesn't wake shortly after the seizure or has new  problems such as difficulty seeing, speaking or moving following the seizure C.  The patient was injured during the seizure D.  The patient has a temperature over 102 F (39C) E.  The patient vomited during the seizure and now is having trouble breathing    During the Seizure   - First, ensure adequate ventilation and place patients on the floor on their left side  Loosen clothing around the neck and ensure the airway is patent. If the patient is clenching the teeth, do not force the mouth open with any object as this can cause severe damage - Remove all items from the surrounding that can be hazardous. The patient may be oblivious to what's happening and may not even know what he or she is doing. If the patient is confused and wandering, either gently guide him/her away and block access to outside areas - Reassure the individual and be comforting - Call 911. In most cases, the seizure ends before EMS arrives. However, there are cases when seizures may last over 3 to 5 minutes. Or the individual may have developed breathing difficulties or severe injuries. If a pregnant patient or a person with diabetes develops a seizure, it is prudent to call an ambulance. - Finally, if the patient does not regain full consciousness, then call EMS. Most patients will remain confused for about 45 to 90 minutes after a seizure, so you must use judgment in calling for help. - Avoid restraints but make sure the patient is in a bed with padded side rails - Place the individual in a lateral position with the neck slightly flexed; this will help the saliva drain from the mouth and prevent the tongue from falling backward - Remove all nearby furniture and other hazards from the area - Provide verbal assurance as the individual is regaining consciousness - Provide the patient with privacy if possible - Call for help and start treatment as ordered by the caregiver    After the Seizure (Postictal Stage)   After a  seizure, most patients experience confusion, fatigue, muscle pain and/or a headache. Thus, one should permit the individual to sleep. For the next few days, reassurance is essential. Being calm and helping reorient the person is also of importance.   Most seizures are painless and end spontaneously. Seizures are not harmful to others but can lead to complications such as stress on the lungs, brain and the heart. Individuals with prior lung problems may develop labored breathing and respiratory distress.   I have spent a total of  35 minutes with the patient reviewing hospital notes,  test results, labs and examining the patient as well as establishing an assessment and plan that was discussed personally with the patient, father at  bedside and Dr. Sharon Seller  > 50% of time was spent in direct patient care.    Lindie Spruce Epilepsy [facility] For questions after 5pm please refer to AMION to reach the Neurologist on call

## 2019-11-05 NOTE — Progress Notes (Signed)
EEG offline.Now EEG is back on server. LTM maint complete - no skin breakdown under:fp1,f7
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
## 2019-11-05 NOTE — Progress Notes (Signed)
   11/05/19   To Whom it may concern,  Jeremy Short was admitted to [facility] on 11/03/2019  and remains under my care in the hospital as of 11/05/19. At this time it is not yet clear when he will be able to be safely discharged from the hospital.   Sincerely,   Lonia Blood, MD (electronically signed via Epic) [facility] Office  [phone]

## 2019-11-05 NOTE — Procedures (Signed)
Patient Name: Jeremy Short  MRN: [id number]  Epilepsy Attending: Charlsie Quest  Referring Physician/Provider: Dr Brooke Dare Duration: 11/04/2019 1024 to 11/05/2019 1149  Patient history: 24 year old male with new onset seizure. EEG to evaluate for seizure.  Level of alertness: Awake, asleep  AEDs during EEG study: None  Technical aspects: This EEG study was done with scalp electrodes positioned according to the 10-20 International system of electrode placement. Electrical activity was acquired at a sampling rate of 500Hz  and reviewed with a high frequency filter of 70Hz  and a low frequency filter of 1Hz . EEG data were recorded continuously and digitally stored.   Description: The posterior dominant rhythm consists of 6.5-7Hz  activity of moderate voltage (25-35 uV) seen predominantly in posterior head regions, symmetric and reactive to eye opening and eye closing.  Sleep was characterized by vertex waves, sleep spindles (12 to 14 Hz), maximal frontocentral region.  EEG showed continuous generalized 6-7Hz  theta slowing. Hyperventilation and photic stimulation were not performed.     ABNORMALITY -Continuous slow, generalized  IMPRESSION: This study is suggestive of mild to moderate diffuse encephalopathy, nonspecific etiology. No seizures or epileptiform discharges were seen throughout the recording.  [name] 

## 2019-11-05 NOTE — Progress Notes (Signed)
LTM EEG discontinued - no skin breakdown at unhook.   

## 2019-11-06 ENCOUNTER — Encounter (HOSPITAL_COMMUNITY): Payer: Self-pay | Admitting: *Deleted

## 2019-11-06 DIAGNOSIS — N179 Acute kidney failure, unspecified: Secondary | ICD-10-CM | POA: Diagnosis not present

## 2019-11-06 LAB — COMPREHENSIVE METABOLIC PANEL
ALT: 21 U/L (ref 0–44)
AST: 30 U/L (ref 15–41)
Albumin: 3.6 g/dL (ref 3.5–5.0)
Alkaline Phosphatase: 68 U/L (ref 38–126)
Anion gap: 13 (ref 5–15)
BUN: 11 mg/dL (ref 6–20)
CO2: 24 mmol/L (ref 22–32)
Calcium: 8.6 mg/dL — ABNORMAL LOW (ref 8.9–10.3)
Chloride: 102 mmol/L (ref 98–111)
Creatinine, Ser: 1.1 mg/dL (ref 0.61–1.24)
GFR calc Af Amer: >60 mL/min (ref >60)
GFR calc non Af Amer: >60 mL/min (ref >60)
Glucose, Bld: 108 mg/dL — ABNORMAL HIGH (ref 70–99)
Potassium: 3.1 mmol/L — ABNORMAL LOW (ref 3.5–5.1)
Sodium: 139 mmol/L (ref 135–145)
Total Bilirubin: 1 mg/dL (ref 0.3–1.2)
Total Protein: 6.2 g/dL — ABNORMAL LOW (ref 6.5–8.1)

## 2019-11-06 LAB — CBC
HCT: 37.9 % — ABNORMAL LOW (ref 39.0–52.0)
Hemoglobin: 12.9 g/dL — ABNORMAL LOW (ref 13.0–17.0)
MCH: 29.1 pg (ref 26.0–34.0)
MCHC: 34 g/dL (ref 30.0–36.0)
MCV: 85.6 fL (ref 80.0–100.0)
Platelets: 197 10*3/uL (ref 150–400)
RBC: 4.43 MIL/uL (ref 4.22–5.81)
RDW: 11.6 % (ref 11.5–15.5)
WBC: 10.4 10*3/uL (ref 4.0–10.5)
nRBC: 0 % (ref 0.0–0.2)

## 2019-11-06 LAB — LACTIC ACID, PLASMA: Lactic Acid, Venous: 0.8 mmol/L (ref 0.5–1.9)

## 2019-11-06 LAB — HSV DNA BY PCR (REFERENCE LAB)
HSV 1 DNA: NEGATIVE
HSV 2 DNA: NEGATIVE

## 2019-11-06 MED ORDER — ALPRAZOLAM 0.5 MG PO TABS: ORAL_TABLET | ORAL | 0 refills | Status: DC

## 2019-11-06 MED ORDER — ACETAMINOPHEN 325 MG PO TABS: 650.0000 mg | ORAL_TABLET | Freq: Four times a day (QID) | ORAL | Status: DC | PRN

## 2019-11-06 MED ORDER — POTASSIUM CHLORIDE CRYS ER 20 MEQ PO TBCR
40.0000 meq | EXTENDED_RELEASE_TABLET | Freq: Once | ORAL | Status: AC
  Administered 2019-11-06: 40 meq via ORAL
  Filled 2019-11-06: qty 2

## 2019-11-06 NOTE — Plan of Care (Signed)
  Problem: Education: Goal: Knowledge of General Education information will improve Description: Including pain rating scale, medication(s)/side effects and non-pharmacologic comfort measures Outcome: Progressing   Problem: Health Behavior/Discharge Planning: Goal: Ability to manage health-related needs will improve Outcome: Progressing   Problem: Clinical Measurements: Goal: Diagnostic test results will improve Outcome: Progressing   Problem: Nutrition: Goal: Adequate nutrition will be maintained Outcome: Progressing   Problem: Coping: Goal: Level of anxiety will decrease Outcome: Progressing   
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
## 2019-11-06 NOTE — Progress Notes (Signed)
PIV removed without complication. Discharge instructions reviewed with patient and mom at bedside. All questions answered. Personal belongings collected from the room by patient's mom. Patient discharged from the unit via wheelchair to personal vehicle driven by mom.

## 2019-11-06 NOTE — TOC Transition Note (Signed)
Transition of Care [facility]) - CM/SW Discharge Note   Patient Details  Name: Jeremy Short MRN: [id number] Date of Birth: 03/18/1996  [facility] CM/SW Contact:  Kermit Balo, RN Phone Number: 11/06/2019, 12:19 PM   Clinical Narrative:    Pt discharging home with self care. Pt has transportation home.    Final next level of care: Home/Self Care Barriers to Discharge: No Barriers Identified   Patient Goals and CMS Choice        Discharge Placement                       Discharge Plan and Services                                     Social Determinants of Health (SDOH) Interventions     Readmission Risk Interventions No flowsheet data found.

## 2019-11-06 NOTE — Plan of Care (Signed)
Pt is alert and oriented. Pt will respond but just delayed. Pt is on RA. Q2h turns.  Problem: Education: Goal: Knowledge of General Education information will improve Description: Including pain rating scale, medication(s)/side effects and non-pharmacologic comfort measures Outcome: Progressing   Problem: Health Behavior/Discharge Planning: Goal: Ability to manage health-related needs will improve Outcome: Progressing   Problem: Clinical Measurements: Goal: Will remain free from infection Outcome: Progressing Goal: Diagnostic test results will improve Outcome: Progressing Goal: Respiratory complications will improve Outcome: Progressing Goal: Cardiovascular complication will be avoided Outcome: Progressing   Problem: Nutrition: Goal: Adequate nutrition will be maintained Outcome: Progressing   Problem: Coping: Goal: Level of anxiety will decrease Outcome: Progressing   Problem: Elimination: Goal: Will not experience complications related to bowel motility Outcome: Progressing Goal: Will not experience complications related to urinary retention Outcome: Progressing   Problem: Pain Managment: Goal: General experience of comfort will improve Outcome: Progressing   Problem: Safety: Goal: Ability to remain free from injury will improve Outcome: Progressing   Problem: Skin Integrity: Goal: Risk for impaired skin integrity will decrease Outcome: Progressing

## 2019-11-06 NOTE — Discharge Instructions (Signed)
DO NOT ABRUPTLY STOP TAKING XANAX - FOLLOW THE TAPERING INSTRUCTIONS PROVIDED AT DISCHARGE AND SEE YOUR PRIMARY CARE PROVIDER FOR FURTHER INSTRUCTIONS    Seizure precautions: Per [place] statutes, patients with seizures are not allowed to drive until they have been seizure-free for six months and cleared by a physician   Use caution when using heavy equipment or power tools. Avoid working on ladders or at heights. Take showers instead of baths. Ensure the water temperature is not too high on the home water heater. Do not go swimming alone. Do not lock yourself in a room alone (i.e. bathroom). When caring for infants or small children, sit down when holding, feeding, or changing them to minimize risk of injury to the child in the event you have a seizure. Maintain good sleep hygiene. Avoid alcohol.   If patienthas another seizure, call 911 and bring them back to the ED if: A. The seizure lasts longer than 5 minutes.  B. The patient doesn't wake shortly after the seizure or has new problems such as difficulty seeing, speaking or moving following the seizure C. The patient was injured during the seizure D. The patient has a temperature over 102 F (39C) E. The patient vomited during the seizure and now is having trouble breathing  During the Seizure  - First, ensure adequate ventilation and place patients on the floor on their left side  Loosen clothing around the neck and ensure the airway is patent. If the patient is clenching the teeth, do not force the mouth open with any object as this can cause severe damage - Remove all items from the surrounding that can be hazardous. The patient may be oblivious to what's happening and may not even know what he or she is doing. If the patient is confused and wandering, either gently guide him/her away and block access to outside areas - Reassure the individual and be comforting - Call 911. In most cases, the seizure ends before  EMS arrives. However, there are cases when seizures may last over 3 to 5 minutes. Or the individual may have developed breathing difficulties or severe injuries. If a pregnant patient or a person with diabetes develops a seizure, it is prudent to call an ambulance. - Finally, if the patient does not regain full consciousness, then call EMS. Most patients will remain confused for about 45 to 90 minutes after a seizure, so you must use judgment in calling for help. - Avoid restraints but make sure the patient is in a bed with padded side rails - Place the individual in a lateral position with the neck slightly flexed; this will help the saliva drain from the mouth and prevent the tongue from falling backward - Remove all nearby furniture and other hazards from the area - Provide verbal assurance as the individual is regaining consciousness - Provide the patient with privacy if possible - Call for help and start treatment as ordered by the caregiver  After the Seizure (Postictal Stage)  After a seizure, most patients experience confusion, fatigue, muscle pain and/or a headache. Thus, one should permit the individual to sleep. For the next few days, reassurance is essential. Being calm and helping reorient the person is also of importance.  Most seizures are painless and end spontaneously. Seizures are not harmful to others but can lead to complications such as stress on the lungs, brain and the heart. Individuals with prior lung problems may develop labored breathing and respiratory distress.  I have spent a  total of 35 minuteswith the patient reviewing hospitalnotes,  test results, labs and examining the patient as well as establishing an assessment and plan that was discussed personally with the patient, father at bedside and Dr. Justice Deeds of time was spent in direct patient care.  Lindie Spruce Epilepsy [facility] For questions after 5pm please refer to AMION to  reach the Neurologist on call         Note Details  Author Charlsie Quest, MD File Time 11/05/2019 2:56 PM  Author Type Physician Status Signed  Last Editor Charlsie Quest, MD Service Neurology  Hospital Acct # [id number] Admit Date 11/03/2019

## 2019-11-07 LAB — CSF CULTURE W GRAM STAIN: Culture: NO GROWTH

## 2019-11-09 LAB — CULTURE, BLOOD (SINGLE)
Culture: NO GROWTH
Special Requests: ADEQUATE

## 2020-01-15 ENCOUNTER — Ambulatory Visit: Payer: Medicaid Other | Admitting: Diagnostic Neuroimaging

## 2020-03-06 ENCOUNTER — Other Ambulatory Visit: Payer: Self-pay

## 2020-03-06 ENCOUNTER — Ambulatory Visit (HOSPITAL_COMMUNITY): Payer: Medicaid Other | Admitting: Licensed Clinical Social Worker

## 2020-03-06 DIAGNOSIS — F1990 Other psychoactive substance use, unspecified, uncomplicated: Secondary | ICD-10-CM

## 2020-03-06 NOTE — Progress Notes (Signed)
Clinician met with client in person from 9:15am-10:12am. Clinician reviewed client intake paperwork. Clinician and client reviewed levels of care, OPT vs IOP and clinician allowed time for questions prior to assessment. Clinician shared SAIOP (due to client having medicaid and would need to be seen at new faiclity) is an abstinace based program. Clinician provided psychoedcation on hopsital detox vs suboxone taper. Client declined SAIOP at this time due to desire of continued use of delta 8 products and xanax in addition to desire for self taper of suboxone. Clinician provided client with information for [facility], detox at [facility] and [facility] which provides outpatient therapy, medication mangement, and a suboxone clinic. Client is in agreement with referral and will discuss with family self-tapering suboxone and continuing with xanax. Client may schedule new intake assessment for SAIOP at [facility] in the future if he decides to agree with an abstiance based higher level of care.  At the time of assessment client was fully oriented and denied SI/HI/psychosis.  Micheline Chapman, LCSW, LCAS

## 2020-03-10 ENCOUNTER — Encounter (HOSPITAL_COMMUNITY): Payer: Self-pay

## 2020-03-10 ENCOUNTER — Emergency Department (HOSPITAL_COMMUNITY): Payer: Medicaid Other

## 2020-03-10 ENCOUNTER — Other Ambulatory Visit: Payer: Self-pay

## 2020-03-10 ENCOUNTER — Inpatient Hospital Stay (HOSPITAL_COMMUNITY)
Admission: EM | Admit: 2020-03-10 | Discharge: 2020-03-12 | DRG: 917 | Disposition: A | Discharge status: Home / Self Care | Payer: Medicaid Other | Attending: Internal Medicine | Admitting: Internal Medicine

## 2020-03-10 ENCOUNTER — Observation Stay (HOSPITAL_COMMUNITY): Payer: Medicaid Other

## 2020-03-10 DIAGNOSIS — Z9119 Patient's noncompliance with other medical treatment and regimen: Secondary | ICD-10-CM

## 2020-03-10 DIAGNOSIS — F419 Anxiety disorder, unspecified: Secondary | ICD-10-CM | POA: Diagnosis present

## 2020-03-10 DIAGNOSIS — R6889 Other general symptoms and signs: Secondary | ICD-10-CM | POA: Diagnosis not present

## 2020-03-10 DIAGNOSIS — E876 Hypokalemia: Secondary | ICD-10-CM | POA: Diagnosis present

## 2020-03-10 DIAGNOSIS — Z20822 Contact with and (suspected) exposure to covid-19: Secondary | ICD-10-CM | POA: Diagnosis present

## 2020-03-10 DIAGNOSIS — E860 Dehydration: Secondary | ICD-10-CM | POA: Diagnosis present

## 2020-03-10 DIAGNOSIS — F22 Delusional disorders: Secondary | ICD-10-CM | POA: Diagnosis present

## 2020-03-10 DIAGNOSIS — Z79899 Other long term (current) drug therapy: Secondary | ICD-10-CM

## 2020-03-10 DIAGNOSIS — T424X5A Adverse effect of benzodiazepines, initial encounter: Secondary | ICD-10-CM | POA: Diagnosis present

## 2020-03-10 DIAGNOSIS — F1920 Other psychoactive substance dependence, uncomplicated: Secondary | ICD-10-CM

## 2020-03-10 DIAGNOSIS — R569 Unspecified convulsions: Secondary | ICD-10-CM | POA: Diagnosis present

## 2020-03-10 DIAGNOSIS — Y929 Unspecified place or not applicable: Secondary | ICD-10-CM

## 2020-03-10 DIAGNOSIS — F1123 Opioid dependence with withdrawal: Secondary | ICD-10-CM | POA: Diagnosis present

## 2020-03-10 DIAGNOSIS — G928 Other toxic encephalopathy: Secondary | ICD-10-CM

## 2020-03-10 DIAGNOSIS — F32A Depression, unspecified: Secondary | ICD-10-CM | POA: Diagnosis not present

## 2020-03-10 DIAGNOSIS — F13239 Sedative, hypnotic or anxiolytic dependence with withdrawal, unspecified: Secondary | ICD-10-CM | POA: Diagnosis present

## 2020-03-10 DIAGNOSIS — R410 Disorientation, unspecified: Secondary | ICD-10-CM

## 2020-03-10 DIAGNOSIS — R2990 Unspecified symptoms and signs involving the nervous system: Secondary | ICD-10-CM | POA: Diagnosis not present

## 2020-03-10 DIAGNOSIS — R001 Bradycardia, unspecified: Secondary | ICD-10-CM | POA: Diagnosis present

## 2020-03-10 DIAGNOSIS — T424X1A Poisoning by benzodiazepines, accidental (unintentional), initial encounter: Principal | ICD-10-CM | POA: Diagnosis present

## 2020-03-10 DIAGNOSIS — E46 Unspecified protein-calorie malnutrition: Secondary | ICD-10-CM | POA: Diagnosis present

## 2020-03-10 LAB — CBC WITH DIFFERENTIAL/PLATELET
Abs Immature Granulocytes: 0.05 10*3/uL (ref 0.00–0.07)
Basophils Absolute: 0 10*3/uL (ref 0.0–0.1)
Basophils Relative: 0 %
Eosinophils Absolute: 0 10*3/uL (ref 0.0–0.5)
Eosinophils Relative: 0 %
HCT: 41.3 % (ref 39.0–52.0)
Hemoglobin: 14 g/dL (ref 13.0–17.0)
Immature Granulocytes: 1 %
Lymphocytes Relative: 11 %
Lymphs Abs: 1.2 10*3/uL (ref 0.7–4.0)
MCH: 29.2 pg (ref 26.0–34.0)
MCHC: 33.9 g/dL (ref 30.0–36.0)
MCV: 86.2 fL (ref 80.0–100.0)
Monocytes Absolute: 0.5 10*3/uL (ref 0.1–1.0)
Monocytes Relative: 5 %
Neutro Abs: 9.1 10*3/uL — ABNORMAL HIGH (ref 1.7–7.7)
Neutrophils Relative %: 83 %
Platelets: 202 10*3/uL (ref 150–400)
RBC: 4.79 MIL/uL (ref 4.22–5.81)
RDW: 11.8 % (ref 11.5–15.5)
WBC: 10.8 10*3/uL — ABNORMAL HIGH (ref 4.0–10.5)
nRBC: 0 % (ref 0.0–0.2)

## 2020-03-10 LAB — MAGNESIUM: Magnesium: 1.9 mg/dL (ref 1.7–2.4)

## 2020-03-10 LAB — RAPID URINE DRUG SCREEN, HOSP PERFORMED
Amphetamines: NOT DETECTED
Barbiturates: NOT DETECTED
Benzodiazepines: POSITIVE — AB
Cocaine: NOT DETECTED
Opiates: NOT DETECTED
Tetrahydrocannabinol: POSITIVE — AB

## 2020-03-10 LAB — COMPREHENSIVE METABOLIC PANEL
ALT: 13 U/L (ref 0–44)
AST: 15 U/L (ref 15–41)
Albumin: 4.2 g/dL (ref 3.5–5.0)
Alkaline Phosphatase: 74 U/L (ref 38–126)
Anion gap: 12 (ref 5–15)
BUN: 15 mg/dL (ref 6–20)
CO2: 25 mmol/L (ref 22–32)
Calcium: 8.8 mg/dL — ABNORMAL LOW (ref 8.9–10.3)
Chloride: 104 mmol/L (ref 98–111)
Creatinine, Ser: 0.89 mg/dL (ref 0.61–1.24)
GFR, Estimated: >60 mL/min (ref >60)
Glucose, Bld: 114 mg/dL — ABNORMAL HIGH (ref 70–99)
Potassium: 3.6 mmol/L (ref 3.5–5.1)
Sodium: 141 mmol/L (ref 135–145)
Total Bilirubin: 0.5 mg/dL (ref 0.3–1.2)
Total Protein: 7.2 g/dL (ref 6.5–8.1)

## 2020-03-10 LAB — SALICYLATE LEVEL: Salicylate Lvl: <7 mg/dL — ABNORMAL LOW (ref 7.0–30.0)

## 2020-03-10 LAB — ACETAMINOPHEN LEVEL: Acetaminophen (Tylenol), Serum: <10 ug/mL — ABNORMAL LOW (ref 10–30)

## 2020-03-10 LAB — RESP PANEL BY RT-PCR (FLU A&B, COVID) ARPGX2
Influenza A by PCR: NEGATIVE
Influenza B by PCR: NEGATIVE
SARS Coronavirus 2 by RT PCR: NEGATIVE

## 2020-03-10 LAB — LACTIC ACID, PLASMA: Lactic Acid, Venous: 1.7 mmol/L (ref 0.5–1.9)

## 2020-03-10 LAB — ETHANOL: Alcohol, Ethyl (B): <10 mg/dL (ref <10)

## 2020-03-10 MED ORDER — NICOTINE 21 MG/24HR TD PT24
21.0000 mg | MEDICATED_PATCH | Freq: Every day | TRANSDERMAL | Status: DC
  Administered 2020-03-10 – 2020-03-11 (×2): 21 mg via TRANSDERMAL
  Filled 2020-03-10 (×2): qty 1

## 2020-03-10 MED ORDER — GADOBUTROL 1 MMOL/ML IV SOLN
8.0000 mL | Freq: Once | INTRAVENOUS | Status: AC | PRN
  Administered 2020-03-10: 21:00:00 8 mL via INTRAVENOUS

## 2020-03-10 MED ORDER — LORAZEPAM 2 MG/ML IJ SOLN
1.0000 mg | INTRAMUSCULAR | Status: DC | PRN
  Administered 2020-03-10: 16:00:00 1 mg via INTRAVENOUS
  Administered 2020-03-10 – 2020-03-11 (×2): 2 mg via INTRAVENOUS
  Filled 2020-03-10 (×3): qty 1

## 2020-03-10 MED ORDER — SODIUM CHLORIDE 0.9 % IV BOLUS
1000.0000 mL | Freq: Once | INTRAVENOUS | Status: AC
  Administered 2020-03-10: 07:00:00 1000 mL via INTRAVENOUS

## 2020-03-10 MED ORDER — SODIUM CHLORIDE 0.9 % IV SOLN
75.0000 mL/h | INTRAVENOUS | Status: DC
  Administered 2020-03-10 – 2020-03-12 (×4): 75 mL/h via INTRAVENOUS

## 2020-03-10 MED ORDER — ONDANSETRON HCL 4 MG/2ML IJ SOLN
4.0000 mg | Freq: Four times a day (QID) | INTRAMUSCULAR | Status: DC | PRN
  Administered 2020-03-10 – 2020-03-11 (×3): 4 mg via INTRAVENOUS
  Filled 2020-03-10 (×3): qty 2

## 2020-03-10 MED ORDER — ONDANSETRON HCL 4 MG PO TABS: 4.0000 mg | ORAL_TABLET | Freq: Four times a day (QID) | ORAL | Status: DC | PRN

## 2020-03-10 MED ORDER — MELATONIN 3 MG PO TABS
6.0000 mg | ORAL_TABLET | Freq: Every evening | ORAL | Status: DC | PRN
  Administered 2020-03-10: 23:00:00 6 mg via ORAL
  Filled 2020-03-10: qty 2

## 2020-03-10 MED ORDER — ENOXAPARIN SODIUM 40 MG/0.4ML ~~LOC~~ SOLN
40.0000 mg | Freq: Every day | SUBCUTANEOUS | Status: DC
  Administered 2020-03-10: 13:00:00 40 mg via SUBCUTANEOUS
  Filled 2020-03-10 (×2): qty 0.4

## 2020-03-10 MED ORDER — SODIUM CHLORIDE 0.9 % IV BOLUS
1000.0000 mL | Freq: Once | INTRAVENOUS | Status: AC
  Administered 2020-03-10: 09:00:00 1000 mL via INTRAVENOUS

## 2020-03-10 NOTE — ED Notes (Signed)
Per MD jared segal hold off on inserting IV at this time

## 2020-03-10 NOTE — ED Notes (Signed)
Patient ambulatory to restroom. Explained to patient f unable to urinate will need to In and out cath

## 2020-03-10 NOTE — ED Notes (Signed)
Patient pulled out IV.

## 2020-03-10 NOTE — ED Notes (Signed)
Attempted to call report to [facility]. Will call again
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
## 2020-03-10 NOTE — ED Notes (Signed)
unable to obtain in and out cath due to patient pulling out cath and swinging at staff

## 2020-03-10 NOTE — ED Triage Notes (Signed)
Patient arrived via [facility] due to requesting detox, went to behavioral health but told he had to be drug free before admission. Last use of heroin 4 days ago. Reporting NVD and cold chills.

## 2020-03-10 NOTE — ED Notes (Signed)
Patient states unable to urinate at this time. Informed patient we are going have to perform IN and OUT cath then patient stated "ok"

## 2020-03-10 NOTE — ED Provider Notes (Signed)
Care assumed from S. Upstill PA-C at shift change pending labs and CT head.  See her note for full H&P.   Briefly this is 24 yo male with pmh of chronic xanax use and heroin addiction presenting with AMS x 3 days, borught in by sister. Sister provided history of patient having similar presentation x 4 months ago when he had a seizure thought to 2/2 to xanax withdrawal. He is refusing PO intake x 3 days. He has not had seizure yet, however behavior is the same per sister. On that admission  He had normal eeg,was encephalopathic and had aki, dehydration   Per previous provider patient was noted to be ambulatory to room and participates in exam. He has disordered verbal response and disconjugate gaze. He denies SI, HI, or hallucinations. Last used heroin x 4 days ago. Work up initiated with labs and head ct. All in process at time of shift change. Patient given 1 L NS.    Physical Exam  BP 118/63 (BP Location: Left Arm)   Pulse (!) 57   Temp 98 F (36.7 C) (Oral)   Resp (!) 24   Ht 6\' 1"  (1.854 m)   Wt 77.1 kg   SpO2 99%   BMI 22.43 kg/m   Physical Exam PE: Constitutional: well-developed.  Awake.  Confused HENT: normocephalic, atraumatic.  Disconjugate gaze, PERRL Cardiovascular: normal rate and rhythm, distal pulses intact Pulmonary/Chest: effort normal; breath sounds clear and equal bilaterally; no wheezes or rales Abdominal: soft and nontender Musculoskeletal: full ROM, no edema Neurological: Alert to self only.  Unable to say place and time.  He does follow commands.  No lateralizing weakness appreciated. Mouth is constantly twitching. Skin: warm and dry, no rash, no diaphoresis Psychiatric: Denies suicidal or homicidal ideations.  Abnormal thought content  ED Course/Procedures    Results for orders placed or performed during the hospital encounter of 03/10/20  CBC with Differential  Result Value Ref Range   WBC 10.8 (H) 4.0 - 10.5 K/uL   RBC 4.79 4.22 - 5.81 MIL/uL    Hemoglobin 14.0 13.0 - 17.0 g/dL   HCT 03/12/20 11.9 - 41.7 %   MCV 86.2 80.0 - 100.0 fL   MCH 29.2 26.0 - 34.0 pg   MCHC 33.9 30.0 - 36.0 g/dL   RDW 40.8 14.4 - 81.8 %   Platelets 202 150 - 400 K/uL   nRBC 0.0 0.0 - 0.2 %   Neutrophils Relative % 83 %   Neutro Abs 9.1 (H) 1.7 - 7.7 K/uL   Lymphocytes Relative 11 %   Lymphs Abs 1.2 0.7 - 4.0 K/uL   Monocytes Relative 5 %   Monocytes Absolute 0.5 0.1 - 1.0 K/uL   Eosinophils Relative 0 %   Eosinophils Absolute 0.0 0.0 - 0.5 K/uL   Basophils Relative 0 %   Basophils Absolute 0.0 0.0 - 0.1 K/uL   Immature Granulocytes 1 %   Abs Immature Granulocytes 0.05 0.00 - 0.07 K/uL  Comprehensive metabolic panel  Result Value Ref Range   Sodium 141 135 - 145 mmol/L   Potassium 3.6 3.5 - 5.1 mmol/L   Chloride 104 98 - 111 mmol/L   CO2 25 22 - 32 mmol/L   Glucose, Bld 114 (H) 70 - 99 mg/dL   BUN 15 6 - 20 mg/dL   Creatinine, Ser 56.3 0.61 - 1.24 mg/dL   Calcium 8.8 (L) 8.9 - 10.3 mg/dL   Total Protein 7.2 6.5 - 8.1 g/dL   Albumin 4.2 3.5 -  5.0 g/dL   AST 15 15 - 41 U/L   ALT 13 0 - 44 U/L   Alkaline Phosphatase 74 38 - 126 U/L   Total Bilirubin 0.5 0.3 - 1.2 mg/dL   GFR, Estimated >56 >38 mL/min   Anion gap 12 5 - 15  Ethanol  Result Value Ref Range   Alcohol, Ethyl (B) <10 <10 mg/dL  Salicylate level  Result Value Ref Range   Salicylate Lvl <7.0 (L) 7.0 - 30.0 mg/dL  Acetaminophen level  Result Value Ref Range   Acetaminophen (Tylenol), Serum <10 (L) 10 - 30 ug/mL  Lactic acid, plasma  Result Value Ref Range   Lactic Acid, Venous 1.7 0.5 - 1.9 mmol/L   CT Head Wo Contrast  Result Date: 03/10/2020 CLINICAL DATA:  Mental status change with unknown cause. Heroin detox. EXAM: CT HEAD WITHOUT CONTRAST TECHNIQUE: Contiguous axial images were obtained from the base of the skull through the vertex without intravenous contrast. COMPARISON:  11/04/2019 FINDINGS: Brain: No evidence of acute infarction, hemorrhage, hydrocephalus, extra-axial  collection or mass lesion/mass effect. Vascular: No hyperdense vessel or unexpected calcification. Skull: Normal. Negative for fracture or focal lesion. Sinuses/Orbits: No acute finding. IMPRESSION: Negative head CT. Electronically Signed   By: Marnee Spring M.D.   On: 03/10/2020 07:43    EKG Interpretation  Date/Time:  Monday March 10 2020 08:43:43 EST Ventricular Rate:  47 PR Interval:  154 QRS Duration: 92 QT Interval:  446 QTC Calculation: 394 R Axis:   102 Text Interpretation: Sinus bradycardia Rightward axis Borderline ECG Since last tracing rate slower Confirmed by Linwood Dibbles [phone]) on 03/10/2020 9:26:24 AM         MDM  Patient received in sign out. Please see previous provider note to include MDM up to this point,  CBC with mild leukocytosis of 10.8, otherwise unremarkable. CMP overall unremarkable. Salicylate and acetaminophen levels are negative. Ethanol level is <10. Lactic acid is within normal range. UDS not yet collected as patient has not provided urine sample. Sister at the bedside states he has not noticed him using the bathroom over the last 3 days. Bladder scan ordered. CT head without acute intracranial abnormalities.  EKG shows sinus bradycardia. On reassessment patient is alert to self only still. [place]. He is unable to know what year or month it is. He does follow commands and participates in exam. He still has disconjugate gaze. I noticed that his mouth is constantly twitching during the exam. No obvious seizure-like activity. Covid test ordered.  Given patient is not yet back to baseline feel that he will need hospital admission for acute encephalopathy.  Spoke with Dr. Dairl Ponder with hospitalist service who agrees to assume care of patient and bring into the hospital for further evaluation and management.  He is requesting neuro consult. Case discussed with on call neurologist Dr. Napoleon Form who reviewed patient's chart.  He states neurology  will follow in consult and see patient today.  Patient is fine to stay here at [facility].  Appreciate both consult and admission.    Portions of this note were generated with Scientist, clinical (histocompatibility and immunogenetics). Dictation errors may occur despite best attempts at proofreading.     Shanon Ace, PA-C 03/10/20 2876    Linwood Dibbles, MD 03/11/20 [phone]
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
## 2020-03-10 NOTE — ED Notes (Signed)
Called Floor 4W and nurses phone with no answer

## 2020-03-10 NOTE — ED Notes (Signed)
Patient ambulatory to restroom  ?

## 2020-03-10 NOTE — ED Notes (Signed)
Sister at bedside.

## 2020-03-10 NOTE — ED Notes (Signed)
Bladder scan 120ml 

## 2020-03-10 NOTE — ED Notes (Signed)
Patient transported to CT 

## 2020-03-11 ENCOUNTER — Inpatient Hospital Stay (HOSPITAL_COMMUNITY)
Admit: 2020-03-11 | Discharge: 2020-03-11 | Disposition: A | Discharge status: Home / Self Care | Payer: Medicaid Other | Attending: Internal Medicine | Admitting: Internal Medicine

## 2020-03-11 DIAGNOSIS — R569 Unspecified convulsions: Secondary | ICD-10-CM | POA: Diagnosis present

## 2020-03-11 DIAGNOSIS — Z79899 Other long term (current) drug therapy: Secondary | ICD-10-CM | POA: Diagnosis not present

## 2020-03-11 DIAGNOSIS — F32A Depression, unspecified: Secondary | ICD-10-CM | POA: Diagnosis present

## 2020-03-11 DIAGNOSIS — F419 Anxiety disorder, unspecified: Secondary | ICD-10-CM | POA: Diagnosis present

## 2020-03-11 DIAGNOSIS — T424X1A Poisoning by benzodiazepines, accidental (unintentional), initial encounter: Secondary | ICD-10-CM | POA: Diagnosis present

## 2020-03-11 DIAGNOSIS — T424X5A Adverse effect of benzodiazepines, initial encounter: Secondary | ICD-10-CM | POA: Diagnosis present

## 2020-03-11 DIAGNOSIS — G928 Other toxic encephalopathy: Secondary | ICD-10-CM | POA: Diagnosis present

## 2020-03-11 DIAGNOSIS — F1920 Other psychoactive substance dependence, uncomplicated: Secondary | ICD-10-CM | POA: Diagnosis present

## 2020-03-11 DIAGNOSIS — Z9119 Patient's noncompliance with other medical treatment and regimen: Secondary | ICD-10-CM | POA: Diagnosis not present

## 2020-03-11 DIAGNOSIS — E46 Unspecified protein-calorie malnutrition: Secondary | ICD-10-CM | POA: Diagnosis present

## 2020-03-11 DIAGNOSIS — R410 Disorientation, unspecified: Secondary | ICD-10-CM | POA: Diagnosis not present

## 2020-03-11 DIAGNOSIS — F13239 Sedative, hypnotic or anxiolytic dependence with withdrawal, unspecified: Secondary | ICD-10-CM | POA: Diagnosis present

## 2020-03-11 DIAGNOSIS — R001 Bradycardia, unspecified: Secondary | ICD-10-CM | POA: Diagnosis present

## 2020-03-11 DIAGNOSIS — F1123 Opioid dependence with withdrawal: Secondary | ICD-10-CM | POA: Diagnosis present

## 2020-03-11 DIAGNOSIS — Y929 Unspecified place or not applicable: Secondary | ICD-10-CM | POA: Diagnosis not present

## 2020-03-11 DIAGNOSIS — F22 Delusional disorders: Secondary | ICD-10-CM | POA: Diagnosis present

## 2020-03-11 DIAGNOSIS — E876 Hypokalemia: Secondary | ICD-10-CM | POA: Diagnosis present

## 2020-03-11 DIAGNOSIS — E860 Dehydration: Secondary | ICD-10-CM | POA: Diagnosis present

## 2020-03-11 DIAGNOSIS — Z20822 Contact with and (suspected) exposure to covid-19: Secondary | ICD-10-CM | POA: Diagnosis present

## 2020-03-11 DIAGNOSIS — R6889 Other general symptoms and signs: Secondary | ICD-10-CM | POA: Diagnosis not present

## 2020-03-11 LAB — BASIC METABOLIC PANEL
Anion gap: 11 (ref 5–15)
BUN: 13 mg/dL (ref 6–20)
CO2: 24 mmol/L (ref 22–32)
Calcium: 8.9 mg/dL (ref 8.9–10.3)
Chloride: 106 mmol/L (ref 98–111)
Creatinine, Ser: 0.77 mg/dL (ref 0.61–1.24)
GFR, Estimated: >60 mL/min (ref >60)
Glucose, Bld: 112 mg/dL — ABNORMAL HIGH (ref 70–99)
Potassium: 3.3 mmol/L — ABNORMAL LOW (ref 3.5–5.1)
Sodium: 141 mmol/L (ref 135–145)

## 2020-03-11 LAB — CBC
HCT: 38.9 % — ABNORMAL LOW (ref 39.0–52.0)
Hemoglobin: 13.3 g/dL (ref 13.0–17.0)
MCH: 29.4 pg (ref 26.0–34.0)
MCHC: 34.2 g/dL (ref 30.0–36.0)
MCV: 86.1 fL (ref 80.0–100.0)
Platelets: 228 10*3/uL (ref 150–400)
RBC: 4.52 MIL/uL (ref 4.22–5.81)
RDW: 11.6 % (ref 11.5–15.5)
WBC: 10.8 10*3/uL — ABNORMAL HIGH (ref 4.0–10.5)
nRBC: 0 % (ref 0.0–0.2)

## 2020-03-11 MED ORDER — ALPRAZOLAM 0.25 MG PO TABS
0.2500 mg | ORAL_TABLET | Freq: Two times a day (BID) | ORAL | Status: DC | PRN
  Administered 2020-03-11 – 2020-03-12 (×2): 0.25 mg via ORAL
  Filled 2020-03-11 (×2): qty 1

## 2020-03-11 MED ORDER — POTASSIUM CHLORIDE CRYS ER 20 MEQ PO TBCR
40.0000 meq | EXTENDED_RELEASE_TABLET | Freq: Two times a day (BID) | ORAL | Status: AC
  Administered 2020-03-11 (×2): 40 meq via ORAL
  Filled 2020-03-11: qty 2

## 2020-03-11 MED ORDER — BUPRENORPHINE HCL-NALOXONE HCL 8-2 MG SL SUBL
1.0000 | SUBLINGUAL_TABLET | Freq: Every day | SUBLINGUAL | Status: DC
  Administered 2020-03-11 – 2020-03-12 (×2): 1 via SUBLINGUAL
  Filled 2020-03-11 (×2): qty 1

## 2020-03-11 MED ORDER — GABAPENTIN 100 MG PO CAPS
100.0000 mg | ORAL_CAPSULE | Freq: Three times a day (TID) | ORAL | Status: DC
  Administered 2020-03-11 – 2020-03-12 (×3): 100 mg via ORAL
  Filled 2020-03-11 (×3): qty 1
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
## 2020-03-11 NOTE — Progress Notes (Signed)
EEG complete - results pending 

## 2020-03-12 DIAGNOSIS — R410 Disorientation, unspecified: Secondary | ICD-10-CM

## 2020-03-12 DIAGNOSIS — F1920 Other psychoactive substance dependence, uncomplicated: Secondary | ICD-10-CM

## 2020-03-12 LAB — BASIC METABOLIC PANEL
Anion gap: 12 (ref 5–15)
BUN: 16 mg/dL (ref 6–20)
CO2: 22 mmol/L (ref 22–32)
Calcium: 9 mg/dL (ref 8.9–10.3)
Chloride: 108 mmol/L (ref 98–111)
Creatinine, Ser: 1.02 mg/dL (ref 0.61–1.24)
GFR, Estimated: >60 mL/min (ref >60)
Glucose, Bld: 95 mg/dL (ref 70–99)
Potassium: 4 mmol/L (ref 3.5–5.1)
Sodium: 142 mmol/L (ref 135–145)

## 2020-03-12 LAB — MAGNESIUM: Magnesium: 2.1 mg/dL (ref 1.7–2.4)

## 2020-03-12 NOTE — Procedures (Signed)
Patient Name: Jeremy Short  MRN: [id number]  Epilepsy Attending: Charlsie Quest  Referring Physician/Provider: Dr. Whitney Post Date: 03/11/2020 Duration: 26.27 minutes  Patient history: 24 year old male with history of substance abuse presented with seizure-like episode.  EEG to evaluate for seizures.  Level of alertness: Awake, drowsy, sleep, comatose, lethargic  AEDs during EEG study: Gabapentin  Technical aspects: This EEG study was done with scalp electrodes positioned according to the 10-20 International system of electrode placement. Electrical activity was acquired at a sampling rate of 500Hz  and reviewed with a high frequency filter of 70Hz  and a low frequency filter of 1Hz . EEG data were recorded continuously and digitally stored.   Description: The posterior dominant rhythm consists of 9-10 Hz activity of moderate voltage (25-35 uV) seen predominantly in posterior head regions, symmetric and reactive to eye opening and eye closing.  Hyperventilation did not show any EEG change.  Physiologic photic driving was seen during photic stimulation.    IMPRESSION: This study is within normal limits. No seizures or epileptiform discharges were seen throughout the recording.  Jeremy Short 
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
## 2020-03-12 NOTE — Progress Notes (Signed)
Neurology Progress Note  S: I am feeling better. Less nausea. Have been drinking and eating some crackers. Left eye is blurry which resolves with closing either eye and only happens when both eyes are open. The blurriness is with far away vision. He states this always happens when he is withdrawing. No further flinching movements, no body or head shaking, no confusion. No further abdominal pain.    O: Current vital signs: BP 127/83 (BP Location: Right Arm)   Pulse 72   Temp 99.3 F (37.4 C) (Oral)   Resp (!) 24   Ht 6\' 1"  (1.854 m)   Wt 77.1 kg   SpO2 (!) 70%   BMI 22.43 kg/m  Vital signs in last 24 hours: Temp:  [98.2 F (36.8 C)-100.2 F (37.9 C)] 99.3 F (37.4 C) (12/22 0900) Pulse Rate:  [48-72] 72 (12/22 0900) Resp:  [16-24] 24 (12/22 0900) BP: (101-127)/(62-83) 127/83 (12/22 0900) SpO2:  [70 %-100 %] 70 % (12/22 0900)  GENERAL: Awake, alert in NAD HEENT: Normocephalic and atraumatic, dry mm LUNGS: Normal respiratory effort.  CV: RRR ABDOMEN: Soft, nontender Skin: hot to face and upper chest.  Ext: Clammy feet. Perfusion is normal.   NEURO:  Mental Status: AA&Ox3  Speech/Language: Naming, repetition, fluency, and comprehension intact.  Cranial Nerves:  II: Pupils are dysconjugate. Reactive and less dilated. Visual fields full. Color vision intact.  Snellen OD: 20/20       OS:  20/20       OU: 20/20 III, IV, VI: EOMI. Eyelids elevate symmetrically.  V: Sensation is intact to light touch and symmetrical to face.  VII: Smile is symmetrical. Able to puff cheeks and raise eyebrows.  VIII: hearing intact to voice. IX, X: Palate elevates symmetrically. Phonation is normal.  XI: Shoulder shrug 5/5. XII: tongue is midline without fasciculations. Motor: 5/5 strength to all muscle groups tested.  Tone: is normal and bulk is normal Sensation- Intact to light touch bilateral LEs. Extinction intact.  Coordination: FTN intact bilaterally, HKS: no ataxia in BLE. No drift.   DTRs: 2+ throughout Gait- deferred  No flinching with stimuli as before. No tremors.   Medications  Current Facility-Administered Medications:  .  0.9 %  sodium chloride infusion, 75 mL/hr, Intravenous, Continuous, 08-12-1998, MD, Last Rate: 75 mL/hr at 03/12/20 0804, 75 mL/hr at 03/12/20 0804 .  ALPRAZolam 03/14/20) tablet 0.25 mg, 0.25 mg, Oral, BID PRN, Prudy Feeler N, DO, 0.25 mg at 03/12/20 0948 .  buprenorphine-naloxone (SUBOXONE) 8-2 mg per SL tablet 1 tablet, 1 tablet, Sublingual, Daily, 03/14/20, DO, 1 tablet at 03/12/20 0949 .  enoxaparin (LOVENOX) injection 40 mg, 40 mg, Subcutaneous, Daily, 03/14/20, MD, 40 mg at 03/10/20 1239 .  gabapentin (NEURONTIN) capsule 100 mg, 100 mg, Oral, TID, Hall, Carole N, DO, 100 mg at 03/12/20 0948 .  LORazepam (ATIVAN) injection 1-2 mg, 1-2 mg, Intravenous, Q2H PRN, 03/14/20, MD, 2 mg at 03/11/20 0941 .  melatonin tablet 6 mg, 6 mg, Oral, QHS PRN, 03/13/20, FNP, 6 mg at 03/10/20 2304 .  nicotine (NICODERM CQ - dosed in mg/24 hours) patch 21 mg, 21 mg, Transdermal, Daily, 2305, FNP, 21 mg at 03/11/20 2206 .  ondansetron (ZOFRAN) tablet 4 mg, 4 mg, Oral, Q6H PRN **OR** ondansetron (ZOFRAN) injection 4 mg, 4 mg, Intravenous, Q6H PRN, 2207, MD, 4 mg at 03/11/20 1440 Labs CBC    Component Value Date/Time   WBC 10.8 (H) 03/11/2020 0414  RBC 4.52 03/11/2020 0414   HGB 13.3 03/11/2020 0414   HCT 38.9 (L) 03/11/2020 0414   PLT 228 03/11/2020 0414   MCV 86.1 03/11/2020 0414   MCH 29.4 03/11/2020 0414   MCHC 34.2 03/11/2020 0414   RDW 11.6 03/11/2020 0414   LYMPHSABS 1.2 03/10/2020 0635   MONOABS 0.5 03/10/2020 0635   EOSABS 0.0 03/10/2020 0635   BASOSABS 0.0 03/10/2020 0635    CMP     Component Value Date/Time   NA 142 03/12/2020 0505   K 4.0 03/12/2020 0505   CL 108 03/12/2020 0505   CO2 22 03/12/2020 0505   GLUCOSE 95 03/12/2020 0505   BUN 16 03/12/2020 0505   CREATININE 1.02 03/12/2020  0505   CALCIUM 9.0 03/12/2020 0505   PROT 7.2 03/10/2020 0635   ALBUMIN 4.2 03/10/2020 0635   AST 15 03/10/2020 0635   ALT 13 03/10/2020 0635   ALKPHOS 74 03/10/2020 0635   BILITOT 0.5 03/10/2020 0635   GFRNONAA >60 03/12/2020 0505   GFRAA >60 11/06/2019 0157   Imaging  CT head negative  MRI Brain with no acute findings. No structural abnormalities.   EEG negative for epileptiforml discharges. No seizure activity noted.   Assessment: 24 yo male with hx of heroin abuse and chronic benzo use. Admitted 03/10/20 with AMS, confusion verbal responses and disorientation. He had seen a MD to start Suboxone but had not started it yet. He stopped Heroin use abruptly and last used Xanax 12/19. Neuro was consulted and we felt his sx were from withdrawal. EEG and MRI brain were negative. No witness seizure like activity in the hospital. He had flinching on previous exam, but not today.   Impression: 1. Benzo and/or opioid withdrawal. Overall picture improved. Continue Suboxone. He expresses desire to quit Xanax as well as heroin, and cautioned pt to wean Xanax slowly if he wants to stop. Do not stop abruptly.  2. ? Seizure activity-EEG neg.  3. AMS-MRI brain negative and mental status is back to baseline.  4. Depression-psych consult is still pending.   Recommendations: as above.   No further testing needed per neuro. We will be available for questions.   Pt seen by Jimmye Norman, MSN, APN-BC/Nurse Practitioner/Neuro  Pager: [phone]
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
## 2020-03-12 NOTE — Plan of Care (Signed)
Discharge instructions reviewed with patient.  Waiting for family member to take patient home.

## 2020-03-31 ENCOUNTER — Encounter: Payer: Self-pay | Admitting: Diagnostic Neuroimaging

## 2020-03-31 ENCOUNTER — Ambulatory Visit: Payer: Medicaid Other | Admitting: Diagnostic Neuroimaging

## 2020-12-05 ENCOUNTER — Inpatient Hospital Stay (HOSPITAL_COMMUNITY)
Admission: EM | Admit: 2020-12-05 | Discharge: 2020-12-07 | DRG: 100 | Disposition: A | Discharge status: Home / Self Care | Payer: Medicaid Other | Attending: Internal Medicine | Admitting: Internal Medicine

## 2020-12-05 ENCOUNTER — Emergency Department (HOSPITAL_COMMUNITY): Payer: Medicaid Other

## 2020-12-05 ENCOUNTER — Encounter (HOSPITAL_COMMUNITY): Payer: Self-pay

## 2020-12-05 DIAGNOSIS — F32A Depression, unspecified: Secondary | ICD-10-CM | POA: Diagnosis present

## 2020-12-05 DIAGNOSIS — F1120 Opioid dependence, uncomplicated: Secondary | ICD-10-CM | POA: Diagnosis present

## 2020-12-05 DIAGNOSIS — F1590 Other stimulant use, unspecified, uncomplicated: Secondary | ICD-10-CM | POA: Diagnosis present

## 2020-12-05 DIAGNOSIS — Z791 Long term (current) use of non-steroidal anti-inflammatories (NSAID): Secondary | ICD-10-CM | POA: Diagnosis not present

## 2020-12-05 DIAGNOSIS — R6889 Other general symptoms and signs: Secondary | ICD-10-CM | POA: Diagnosis present

## 2020-12-05 DIAGNOSIS — Z20822 Contact with and (suspected) exposure to covid-19: Secondary | ICD-10-CM | POA: Diagnosis present

## 2020-12-05 DIAGNOSIS — R569 Unspecified convulsions: Secondary | ICD-10-CM

## 2020-12-05 DIAGNOSIS — R4182 Altered mental status, unspecified: Secondary | ICD-10-CM

## 2020-12-05 DIAGNOSIS — F4310 Post-traumatic stress disorder, unspecified: Secondary | ICD-10-CM | POA: Diagnosis present

## 2020-12-05 DIAGNOSIS — F1290 Cannabis use, unspecified, uncomplicated: Secondary | ICD-10-CM | POA: Diagnosis present

## 2020-12-05 DIAGNOSIS — G4089 Other seizures: Secondary | ICD-10-CM | POA: Diagnosis present

## 2020-12-05 DIAGNOSIS — F19239 Other psychoactive substance dependence with withdrawal, unspecified: Secondary | ICD-10-CM | POA: Diagnosis present

## 2020-12-05 DIAGNOSIS — F1920 Other psychoactive substance dependence, uncomplicated: Secondary | ICD-10-CM | POA: Diagnosis present

## 2020-12-05 DIAGNOSIS — F19231 Other psychoactive substance dependence with withdrawal delirium: Secondary | ICD-10-CM

## 2020-12-05 DIAGNOSIS — Z79899 Other long term (current) drug therapy: Secondary | ICD-10-CM | POA: Diagnosis not present

## 2020-12-05 DIAGNOSIS — F1490 Cocaine use, unspecified, uncomplicated: Secondary | ICD-10-CM | POA: Diagnosis present

## 2020-12-05 DIAGNOSIS — D72828 Other elevated white blood cell count: Secondary | ICD-10-CM | POA: Diagnosis present

## 2020-12-05 DIAGNOSIS — F19939 Other psychoactive substance use, unspecified with withdrawal, unspecified: Secondary | ICD-10-CM | POA: Diagnosis present

## 2020-12-05 DIAGNOSIS — G928 Other toxic encephalopathy: Secondary | ICD-10-CM | POA: Diagnosis present

## 2020-12-05 DIAGNOSIS — F13239 Sedative, hypnotic or anxiolytic dependence with withdrawal, unspecified: Secondary | ICD-10-CM | POA: Diagnosis not present

## 2020-12-05 LAB — CBC WITH DIFFERENTIAL/PLATELET
Abs Immature Granulocytes: 0.07 10*3/uL (ref 0.00–0.07)
Basophils Absolute: 0 10*3/uL (ref 0.0–0.1)
Basophils Relative: 0 %
Eosinophils Absolute: 0 10*3/uL (ref 0.0–0.5)
Eosinophils Relative: 0 %
HCT: 49.7 % (ref 39.0–52.0)
Hemoglobin: 16.5 g/dL (ref 13.0–17.0)
Immature Granulocytes: 0 %
Lymphocytes Relative: 8 %
Lymphs Abs: 1.5 10*3/uL (ref 0.7–4.0)
MCH: 29.6 pg (ref 26.0–34.0)
MCHC: 33.2 g/dL (ref 30.0–36.0)
MCV: 89.2 fL (ref 80.0–100.0)
Monocytes Absolute: 0.8 10*3/uL (ref 0.1–1.0)
Monocytes Relative: 4 %
Neutro Abs: 16.3 10*3/uL — ABNORMAL HIGH (ref 1.7–7.7)
Neutrophils Relative %: 88 %
Platelets: 268 10*3/uL (ref 150–400)
RBC: 5.57 MIL/uL (ref 4.22–5.81)
RDW: 11.8 % (ref 11.5–15.5)
WBC: 18.8 10*3/uL — ABNORMAL HIGH (ref 4.0–10.5)
nRBC: 0 % (ref 0.0–0.2)

## 2020-12-05 LAB — URINALYSIS, ROUTINE W REFLEX MICROSCOPIC
Bacteria, UA: NONE SEEN
Glucose, UA: NEGATIVE mg/dL
Hgb urine dipstick: NEGATIVE
Ketones, ur: 40 mg/dL — AB
Leukocytes,Ua: NEGATIVE
Nitrite: NEGATIVE
Protein, ur: 30 mg/dL — AB
Specific Gravity, Urine: >1.03 — ABNORMAL HIGH (ref 1.005–1.030)
pH: 6 (ref 5.0–8.0)

## 2020-12-05 LAB — COMPREHENSIVE METABOLIC PANEL
ALT: 16 U/L (ref 0–44)
AST: 20 U/L (ref 15–41)
Albumin: 5.3 g/dL — ABNORMAL HIGH (ref 3.5–5.0)
Alkaline Phosphatase: 67 U/L (ref 38–126)
Anion gap: 15 (ref 5–15)
BUN: 15 mg/dL (ref 6–20)
CO2: 26 mmol/L (ref 22–32)
Calcium: 9.9 mg/dL (ref 8.9–10.3)
Chloride: 103 mmol/L (ref 98–111)
Creatinine, Ser: 0.96 mg/dL (ref 0.61–1.24)
GFR, Estimated: >60 mL/min (ref >60)
Glucose, Bld: 112 mg/dL — ABNORMAL HIGH (ref 70–99)
Potassium: 4.3 mmol/L (ref 3.5–5.1)
Sodium: 144 mmol/L (ref 135–145)
Total Bilirubin: 1 mg/dL (ref 0.3–1.2)
Total Protein: 8.6 g/dL — ABNORMAL HIGH (ref 6.5–8.1)

## 2020-12-05 LAB — RAPID URINE DRUG SCREEN, HOSP PERFORMED
Amphetamines: POSITIVE — AB
Barbiturates: NOT DETECTED
Benzodiazepines: POSITIVE — AB
Cocaine: POSITIVE — AB
Opiates: NOT DETECTED
Tetrahydrocannabinol: POSITIVE — AB

## 2020-12-05 LAB — LACTIC ACID, PLASMA
Lactic Acid, Venous: 2.4 mmol/L (ref 0.5–1.9)
Lactic Acid, Venous: 1.9 mmol/L (ref 0.5–1.9)

## 2020-12-05 MED ORDER — HYDROXYZINE HCL 25 MG PO TABS: 25.0000 mg | ORAL_TABLET | Freq: Three times a day (TID) | ORAL | Status: DC | PRN

## 2020-12-05 MED ORDER — ONDANSETRON HCL 4 MG/2ML IJ SOLN
4.0000 mg | Freq: Once | INTRAMUSCULAR | Status: AC
  Administered 2020-12-05: 4 mg via INTRAVENOUS
  Filled 2020-12-05: qty 2

## 2020-12-05 MED ORDER — THIAMINE HCL 100 MG/ML IJ SOLN
Freq: Once | INTRAVENOUS | Status: AC
  Filled 2020-12-05: qty 1000

## 2020-12-05 MED ORDER — BUPRENORPHINE HCL-NALOXONE HCL 8-2 MG SL FILM: 1.0000 | ORAL_FILM | Freq: Every day | SUBLINGUAL | Status: DC

## 2020-12-05 MED ORDER — ONDANSETRON HCL 4 MG/2ML IJ SOLN
4.0000 mg | Freq: Four times a day (QID) | INTRAMUSCULAR | Status: DC | PRN
  Administered 2020-12-06 (×4): 4 mg via INTRAVENOUS
  Filled 2020-12-05 (×4): qty 2

## 2020-12-05 MED ORDER — DIAZEPAM 5 MG PO TABS
5.0000 mg | ORAL_TABLET | Freq: Three times a day (TID) | ORAL | Status: DC
  Administered 2020-12-05 – 2020-12-06 (×2): 5 mg via ORAL
  Filled 2020-12-05 (×2): qty 1

## 2020-12-05 MED ORDER — SODIUM CHLORIDE 0.9 % IV BOLUS
500.0000 mL | Freq: Once | INTRAVENOUS | Status: AC
  Administered 2020-12-05: 500 mL via INTRAVENOUS

## 2020-12-05 MED ORDER — ENOXAPARIN SODIUM 40 MG/0.4ML IJ SOSY
40.0000 mg | PREFILLED_SYRINGE | INTRAMUSCULAR | Status: DC
  Administered 2020-12-06: 40 mg via SUBCUTANEOUS
  Filled 2020-12-05: qty 0.4

## 2020-12-05 MED ORDER — MELOXICAM 15 MG PO TABS: 15.0000 mg | ORAL_TABLET | Freq: Every day | ORAL | Status: DC

## 2020-12-05 MED ORDER — ONDANSETRON HCL 4 MG PO TABS
4.0000 mg | ORAL_TABLET | Freq: Four times a day (QID) | ORAL | Status: DC | PRN
  Administered 2020-12-07: 4 mg via ORAL
  Filled 2020-12-05: qty 1

## 2020-12-05 NOTE — H&P (Signed)
History and Physical    Jeremy Short DJM:[id number] DOB: 1996-01-13 DOA: 12/05/2020  [facility]: [facility]  Patient coming from: Home  I have personally briefly reviewed patient's old medical records in [facility]  Chief Complaint: Seizure  HPI: Jeremy Short is a 25 y.o. male with medical history significant of polysubstance use disorder.  This is his third admission in the last year for acute opiate withdrawal.  He reports ongoing use of benzodiazepines as well as heroin.  He does not use IV drugs.  He tries to self detox at home and begins taking Suboxone but also stopped his benzodiazepines.  This resulted in a generalized tonic-clonic seizure for about 1 minute today.  His sister brings him in for work-up.  He has previously had a seizure related to benzodiazepine withdrawal in the past. ED Course: In the ED he was noted to be somewhat delusional as well as postictal and had some altered mental status.  He was given IV fluid hydration.  Lactic acid is initially 2.4 but came down to 1.9 following this.  He had a CT of the head that was negative.  His urine drug screen was positive for cocaine benzos and amphetamine and THC.  He denies ongoing use of either amphetamines or cocaine but notes he was in the presence of in the last week.  Review of Systems: As per HPI otherwise 10 point review of systems negative.  Patient does report ongoing nausea.  Past Medical History:  Diagnosis Date   Depression     History reviewed. No pertinent surgical history.   reports that he has never smoked. He has never used smokeless tobacco. He reports current drug use. Drug: Marijuana. He reports that he does not drink alcohol.  No Known Allergies  Family History  Family history unknown: Yes   Patient lives at home with his parents and siblings.  Prior to Admission medications   Medication Sig Start Date End Date Taking? Authorizing Provider  ALPRAZolam Prudy Feeler) 1 MG tablet Take  1 mg by mouth 2 (two) times daily as needed for anxiety. 12/17/19   [provider]  cloNIDine (CATAPRES) 0.1 MG tablet Take 0.1 mg by mouth 3 (three) times daily. 01/20/20   [provider]  gabapentin (NEURONTIN) 300 MG capsule Take 300 mg by mouth 3 (three) times daily. 01/12/20   [provider]  meloxicam (MOBIC) 15 MG tablet Take 15 mg by mouth daily. 01/12/20   [provider]  sertraline (ZOLOFT) 100 MG tablet Take 100 mg by mouth daily.  Patient not taking: Reported on 03/10/2020    [provider]  SUBOXONE 8-2 MG FILM Place 1 each under the tongue daily. 01/09/20   [provider]    Physical Exam: Vitals:   12/05/20 2045 12/05/20 2100 12/05/20 2200 12/05/20 2230  BP:  110/60 114/71 (!) 105/54  Pulse: (!) 48 63 (!) 51 (!) 45  Resp:  18 18 18   Temp:      TempSrc:      SpO2: 100% 96% 100% (!) 72%    Constitutional: NAD, calm, comfortable, thin Eyes: PERRL, lids and conjunctivae normal ENMT: Mucous membranes are moist. Posterior pharynx clear of any exudate or lesions.Normal dentition.  Neck: normal, supple, no masses, no thyromegaly Respiratory: clear to auscultation bilaterally, no wheezing, no crackles. Normal respiratory effort. No accessory muscle use.  Cardiovascular: Regular rate and rhythm, no murmurs / rubs / gallops. No extremity edema. 2+ pedal pulses. No carotid bruits.  Abdomen:  no tenderness, no masses palpated. No hepatosplenomegaly. Bowel sounds positive.  Musculoskeletal: no clubbing / cyanosis. No joint deformity upper and lower extremities. Good ROM, no contractures. Normal muscle tone.  Skin: no rashes, lesions, ulcers. No induration Neurologic: Grossly normal Psychiatric: Normal judgment and insight. Alert and oriented x 3. Normal mood.   Labs on Admission: I have personally reviewed following labs and imaging studies  CBC: Recent Labs  Lab 12/05/20 1500  WBC 18.8*  NEUTROABS 16.3*  HGB 16.5   HCT 49.7  MCV 89.2  PLT 268   Basic Metabolic Panel: Recent Labs  Lab 12/05/20 1500  NA 144  K 4.3  CL 103  CO2 26  GLUCOSE 112*  BUN 15  CREATININE 0.96  CALCIUM 9.9   GFR: CrCl cannot be calculated (Unknown ideal weight.). Liver Function Tests: Recent Labs  Lab 12/05/20 1500  AST 20  ALT 16  ALKPHOS 67  BILITOT 1.0  PROT 8.6*  ALBUMIN 5.3*    Urine analysis:    Component Value Date/Time   COLORURINE AMBER (A) 12/05/2020 1906   APPEARANCEUR CLEAR (A) 12/05/2020 1906   LABSPEC >1.030 (H) 12/05/2020 1906   PHURINE 6.0 12/05/2020 1906   GLUCOSEU NEGATIVE 12/05/2020 1906   HGBUR NEGATIVE 12/05/2020 1906   BILIRUBINUR SMALL (A) 12/05/2020 1906   KETONESUR 40 (A) 12/05/2020 1906   PROTEINUR 30 (A) 12/05/2020 1906   UROBILINOGEN 1.0 07/24/2014 2114   NITRITE NEGATIVE 12/05/2020 1906   LEUKOCYTESUR NEGATIVE 12/05/2020 1906    Radiological Exams on Admission: CT Head Wo Contrast  Result Date: 12/05/2020 CLINICAL DATA:  Mental status change.  Seizure. EXAM: CT HEAD WITHOUT CONTRAST TECHNIQUE: Contiguous axial images were obtained from the base of the skull through the vertex without intravenous contrast. COMPARISON:  MRI brain and CT head 03/10/2020. FINDINGS: Brain: No evidence of acute infarction, hemorrhage, hydrocephalus, extra-axial collection or mass lesion/mass effect. Vascular: No hyperdense vessel or unexpected calcification. Skull: Normal. Negative for fracture or focal lesion. Sinuses/Orbits: No acute finding. Other: None. IMPRESSION: No acute intracranial abnormality. Electronically Signed   By: Darliss Cheney M.D.   On: 12/05/2020 21:30    EKG: Independently reviewed.  Sinus bradycardia no acute changes  Assessment/Plan Active Problems:   New onset seizure (HCC)   Toxic metabolic encephalopathy   Withdrawal complaint   Drug addiction (HCC)   New onset seizure On a previous admission had EEG work-up that was negative. Suspect this is due to  benzodiazepine withdrawal. Will begin long-acting benzodiazepine to prevent further seizures  Metabolic encephalopathy Appears to be wearing off The EDP said he was delusional however I do not find this to be the case on interviewing him.  Ongoing opiate use disorder Patient is motivated to get clean. Suboxone to be used while in house to prevent opiate withdrawal  Withdrawal symptoms Antinausea medicine and Imodium can be used to prevent symptoms.  DVT prophylaxis: Lovenox SQ Code Status: Full code  Family Communication: Patient at bedside Disposition Plan: Home Consults called: None Admission status: Inpatient patient is admitted to inpatient status due to ongoing work-up, unsafe discharge plan.   Reva Bores MD [facility]  If 7PM-7AM, please contact night-coverage 12/05/2020, 11:13 PM
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
## 2020-12-05 NOTE — ED Provider Notes (Signed)
Emergency Medicine Provider Triage Evaluation Note  Jeremy Short , a 25 y.o. male  was evaluated in triage.  Pt complains of dizziness and confusion following a seizure.  Same witnessed by mom, apparently lasted approximately 1 minute.  Of note, patient endorses regular Xanax use for 3 years cannot remember if he took it in the last 3 days.  No tongue biting or voiding on self.  Review of Systems  Positive: Dizzy, confusion, memory loss Negative: Fevers, chills, shortness of breath, chest pain  Physical Exam  BP 121/77   Pulse (!) 57   Temp 98.7 F (37.1 C) (Oral)   Resp 16   SpO2 100%  Gen:   Awake, no distress   Resp:  Normal effort  MSK:   Moves extremities without difficulty  Other:  Focally intact on brief neuro exam  Medical Decision Making  Medically screening exam initiated at 2:38 PM.  Appropriate orders placed.  Kavion Abu-Dames was informed that the remainder of the evaluation will be completed by another provider, this initial triage assessment does not replace that evaluation, and the importance of remaining in the ED until their evaluation is complete.     Vear Clock 12/05/20 1440    Lorre Nick, MD 12/06/20 1144

## 2020-12-05 NOTE — ED Notes (Signed)
Pt aware urine sample is needed. Urinal placed at bedside.

## 2020-12-05 NOTE — ED Triage Notes (Signed)
Pt arrives via ems after having a seizure at home that lasted about 1 min. Pt alert and oriented x 4. BGL was 100. He states his last seizure was about 6 months ago, denies being prescribed any meds for the seizures.

## 2020-12-06 ENCOUNTER — Other Ambulatory Visit: Payer: Self-pay

## 2020-12-06 DIAGNOSIS — R569 Unspecified convulsions: Secondary | ICD-10-CM

## 2020-12-06 LAB — CBC WITH DIFFERENTIAL/PLATELET
Abs Immature Granulocytes: 0.05 10*3/uL (ref 0.00–0.07)
Basophils Absolute: 0 10*3/uL (ref 0.0–0.1)
Basophils Relative: 0 %
Eosinophils Absolute: 0 10*3/uL (ref 0.0–0.5)
Eosinophils Relative: 0 %
HCT: 43.8 % (ref 39.0–52.0)
Hemoglobin: 14.5 g/dL (ref 13.0–17.0)
Immature Granulocytes: 0 %
Lymphocytes Relative: 18 %
Lymphs Abs: 2.2 10*3/uL (ref 0.7–4.0)
MCH: 29.7 pg (ref 26.0–34.0)
MCHC: 33.1 g/dL (ref 30.0–36.0)
MCV: 89.6 fL (ref 80.0–100.0)
Monocytes Absolute: 0.8 10*3/uL (ref 0.1–1.0)
Monocytes Relative: 6 %
Neutro Abs: 9.6 10*3/uL — ABNORMAL HIGH (ref 1.7–7.7)
Neutrophils Relative %: 76 %
Platelets: 224 10*3/uL (ref 150–400)
RBC: 4.89 MIL/uL (ref 4.22–5.81)
RDW: 11.9 % (ref 11.5–15.5)
WBC: 12.7 10*3/uL — ABNORMAL HIGH (ref 4.0–10.5)
nRBC: 0 % (ref 0.0–0.2)

## 2020-12-06 LAB — RESP PANEL BY RT-PCR (FLU A&B, COVID) ARPGX2
Influenza A by PCR: NEGATIVE
Influenza B by PCR: NEGATIVE
SARS Coronavirus 2 by RT PCR: NEGATIVE

## 2020-12-06 MED ORDER — CHLORDIAZEPOXIDE HCL 5 MG PO CAPS
5.0000 mg | ORAL_CAPSULE | Freq: Three times a day (TID) | ORAL | Status: DC
  Administered 2020-12-06 (×2): 5 mg via ORAL
  Filled 2020-12-06 (×2): qty 1

## 2020-12-06 MED ORDER — LOPERAMIDE HCL 2 MG PO CAPS: 2.0000 mg | ORAL_CAPSULE | ORAL | Status: DC | PRN

## 2020-12-06 MED ORDER — NICOTINE 14 MG/24HR TD PT24
14.0000 mg | MEDICATED_PATCH | Freq: Every day | TRANSDERMAL | Status: DC
  Administered 2020-12-06: 14 mg via TRANSDERMAL
  Filled 2020-12-06 (×2): qty 1

## 2020-12-06 MED ORDER — GABAPENTIN 300 MG PO CAPS
300.0000 mg | ORAL_CAPSULE | Freq: Three times a day (TID) | ORAL | Status: DC
  Administered 2020-12-06 – 2020-12-07 (×3): 300 mg via ORAL
  Filled 2020-12-06 (×3): qty 1

## 2020-12-06 NOTE — Progress Notes (Signed)
PROGRESS NOTE    Jeremy Short  IPJ:[id number] DOB: 02/22/1996 DOA: 12/05/2020 [facility]: [facility]   Chief Complain: Seizure  Brief Narrative: Patient is a 25 year old male with history of polysubstance use, ongoing use of benzodiazepines as well as heroin, not using IV drugs, on Suboxone at home presented with generalized tonic-clonic seizure for about a minute at home.  Sister brought her to the emergency department for work-up.  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  On presentation he was delusional, postictal, altered.  He was started on IV hydration.  CT head's did not show any acute intracranial normalities.  UDS was positive for cocaine, benzos, amphetamine, THC.  Lactic acid was initially elevated, now normalized.  Currently being managed for benzo withdrawal seizure.  Assessment & Plan:   Active Problems:   New onset seizure (HCC)   Toxic metabolic encephalopathy   Withdrawal complaint   Drug addiction (HCC)   Acute drug withdrawal syndrome (HCC)   Benzodiazepine withdrawal seizure: Had history of this in the past as well.  Was admitted for the same in the past.  EEG was negative.  Presented with generalized tonic-clonic seizure, one episode.  Postictal on presentation.  This is most likely benzodiazepine withdrawal seizure.  Started on long-acting benzodiazepine, diazepam changed  to Librium. He follows with [facility], he was started on Xanax for PTSD, he ran out of Xanax few days ago.  Metabolic encephalopathy: Secondary to postictal state.  Continue to monitor mental status.  CT head did not show any acute intracranial abnormalities.  Currently he is alert and oriented.  Ongoing opiate use disorder: UDS positive for cocaine, benzo, amphetamine, THC.  We will request for social worker evaluation. Takes  gabapentin, Xanax, Suboxone at home.  Gabapentin, Suboxone being continued.  Leukocytosis: Most likely reactive.   Improved.  Lactic acid was also elevated on presentation most likely from seizure, has resolved  Will continue to monitor him today.  Plan for discharge tomorrow.         DVT prophylaxis: Lovenox Code Status: Full code Family Communication: None at the bedside Status is: Inpatient  Dispo: The patient is from: Home              Anticipated d/c is to: Home              Patient currently is not medically stable to d/c.   Difficult to place patient No     Consultants: None  Procedures:None  Antimicrobials:  Anti-infectives (From admission, onward)    None       Subjective: Seen and examined the bedside this morning.  Hemodynamically stable.  Alert oriented.  Denies any complaints  Objective: Vitals:   12/05/20 2345 12/06/20 0000 12/06/20 0203 12/06/20 0523  BP: 120/76 113/70 111/62 119/82  Pulse: (!) 46 (!) 44 68 (!) 46  Resp:   18 18  Temp:   98.2 F (36.8 C) 98.9 F (37.2 C)  TempSrc:   Oral Oral  SpO2: 99% 100% 98% 99%    Intake/Output Summary (Last 24 hours) at 12/06/2020 1029 Last data filed at 12/06/2020 1000 Gross per 24 hour  Intake 1202.33 ml  Output 0 ml  Net 1202.33 ml   There were no vitals filed for this visit.  Examination:  General exam: Overall comfortable, not in distress HEENT: PERRL Respiratory system:  no wheezes or crackles  Cardiovascular system: S1 & S2 heard, RRR.  Gastrointestinal system: Abdomen is nondistended, soft and nontender. Central  nervous system: Alert and oriented Extremities: No edema, no clubbing ,no cyanosis Skin: No rashes, no ulcers,no icterus       Data Reviewed: I have personally reviewed following labs and imaging studies  CBC: Recent Labs  Lab 12/05/20 1500 12/06/20 0847  WBC 18.8* 12.7*  NEUTROABS 16.3* 9.6*  HGB 16.5 14.5  HCT 49.7 43.8  MCV 89.2 89.6  PLT 268 224   Basic Metabolic Panel: Recent Labs  Lab 12/05/20 1500  NA 144  K 4.3  CL 103  CO2 26  GLUCOSE 112*  BUN 15   CREATININE 0.96  CALCIUM 9.9   GFR: CrCl cannot be calculated (Unknown ideal weight.). Liver Function Tests: Recent Labs  Lab 12/05/20 1500  AST 20  ALT 16  ALKPHOS 67  BILITOT 1.0  PROT 8.6*  ALBUMIN 5.3*   No results for input(s): LIPASE, AMYLASE in the last 168 hours. No results for input(s): AMMONIA in the last 168 hours. Coagulation Profile: No results for input(s): INR, PROTIME in the last 168 hours. Cardiac Enzymes: No results for input(s): CKTOTAL, CKMB, CKMBINDEX, TROPONINI in the last 168 hours. BNP (last 3 results) No results for input(s): PROBNP in the last 8760 hours. HbA1C: No results for input(s): HGBA1C in the last 72 hours. CBG: No results for input(s): GLUCAP in the last 168 hours. Lipid Profile: No results for input(s): CHOL, HDL, LDLCALC, TRIG, CHOLHDL, LDLDIRECT in the last 72 hours. Thyroid Function Tests: No results for input(s): TSH, T4TOTAL, FREET4, T3FREE, THYROIDAB in the last 72 hours. Anemia Panel: No results for input(s): VITAMINB12, FOLATE, FERRITIN, TIBC, IRON, RETICCTPCT in the last 72 hours. Sepsis Labs: Recent Labs  Lab 12/05/20 1500 12/05/20 1816  LATICACIDVEN 2.4* 1.9    Recent Results (from the past 240 hour(s))  Resp Panel by RT-PCR (Flu A&B, Covid) Nasopharyngeal Swab     Status: None   Collection Time: 12/06/20 12:37 AM   Specimen: Nasopharyngeal Swab; Nasopharyngeal(NP) swabs in vial transport medium  Result Value Ref Range Status   SARS Coronavirus 2 by RT PCR NEGATIVE NEGATIVE Final    Comment: (NOTE) SARS-CoV-2 target nucleic acids are NOT DETECTED.  The SARS-CoV-2 RNA is generally detectable in upper respiratory specimens during the acute phase of infection. The lowest concentration of SARS-CoV-2 viral copies this assay can detect is 138 copies/mL. A negative result does not preclude SARS-Cov-2 infection and should not be used as the sole basis for treatment or other patient management decisions. A negative result  may occur with  improper specimen collection/handling, submission of specimen other than nasopharyngeal swab, presence of viral mutation(s) within the areas targeted by this assay, and inadequate number of viral copies(<138 copies/mL). A negative result must be combined with clinical observations, patient history, and epidemiological information. The expected result is Negative.  Fact Sheet for Patients:  BloggerCourse.com  Fact Sheet for Healthcare Providers:  SeriousBroker.it  This test is no t yet approved or cleared by the [place] FDA and  has been authorized for detection and/or diagnosis of SARS-CoV-2 by FDA under an Emergency Use Authorization (EUA). This EUA will remain  in effect (meaning this test can be used) for the duration of the COVID-19 declaration under Section 564(b)(1) of the Act, 21 U.S.C.section 360bbb-3(b)(1), unless the authorization is terminated  or revoked sooner.       Influenza A by PCR NEGATIVE NEGATIVE Final   Influenza B by PCR NEGATIVE NEGATIVE Final    Comment: (NOTE) The Xpert Xpress SARS-CoV-2/FLU/RSV plus assay is intended as  an aid in the diagnosis of influenza from Nasopharyngeal swab specimens and should not be used as a sole basis for treatment. Nasal washings and aspirates are unacceptable for Xpert Xpress SARS-CoV-2/FLU/RSV testing.  Fact Sheet for Patients: BloggerCourse.com  Fact Sheet for Healthcare Providers: SeriousBroker.it  This test is not yet approved or cleared by the [place] FDA and has been authorized for detection and/or diagnosis of SARS-CoV-2 by FDA under an Emergency Use Authorization (EUA). This EUA will remain in effect (meaning this test can be used) for the duration of the COVID-19 declaration under Section 564(b)(1) of the Act, 21 U.S.C. section 360bbb-3(b)(1), unless the authorization is terminated  or revoked.  Performed at [facility], [address], [place], [place] [address]          Radiology Studies: CT Head Wo Contrast  Result Date: 12/05/2020 CLINICAL DATA:  Mental status change.  Seizure. EXAM: CT HEAD WITHOUT CONTRAST TECHNIQUE: Contiguous axial images were obtained from the base of the skull through the vertex without intravenous contrast. COMPARISON:  MRI brain and CT head 03/10/2020. FINDINGS: Brain: No evidence of acute infarction, hemorrhage, hydrocephalus, extra-axial collection or mass lesion/mass effect. Vascular: No hyperdense vessel or unexpected calcification. Skull: Normal. Negative for fracture or focal lesion. Sinuses/Orbits: No acute finding. Other: None. IMPRESSION: No acute intracranial abnormality. Electronically Signed   By: Darliss Cheney M.D.   On: 12/05/2020 21:30        Scheduled Meds:  Buprenorphine HCl-Naloxone HCl  1 each Sublingual Daily   diazepam  5 mg Oral Q8H   enoxaparin (LOVENOX) injection  40 mg Subcutaneous Q24H   meloxicam  15 mg Oral Daily   Continuous Infusions:   LOS: 1 day    Time spent:25 mins. More than 50% of that time was spent in counseling and/or coordination of care.      Burnadette Pop, MD [facility] P9/17/2022, 10:29 AM

## 2020-12-07 DIAGNOSIS — F19231 Other psychoactive substance dependence with withdrawal delirium: Secondary | ICD-10-CM | POA: Diagnosis not present

## 2020-12-07 LAB — HIV ANTIBODY (ROUTINE TESTING W REFLEX): HIV Screen 4th Generation wRfx: NONREACTIVE

## 2020-12-07 MED ORDER — ONDANSETRON HCL 4 MG PO TABS: 4.0000 mg | ORAL_TABLET | Freq: Four times a day (QID) | ORAL | 0 refills | Status: DC | PRN

## 2020-12-07 MED ORDER — NICOTINE 14 MG/24HR TD PT24
14.0000 mg | MEDICATED_PATCH | Freq: Every day | TRANSDERMAL | 0 refills | Status: DC
Start: 2020-12-07 — End: 2021-02-23

## 2020-12-07 MED ORDER — CHLORDIAZEPOXIDE HCL 5 MG PO CAPS
5.0000 mg | ORAL_CAPSULE | Freq: Two times a day (BID) | ORAL | Status: DC
  Administered 2020-12-07: 5 mg via ORAL
  Filled 2020-12-07: qty 1

## 2020-12-07 MED ORDER — ALPRAZOLAM 1 MG PO TABS: 1.0000 mg | ORAL_TABLET | Freq: Two times a day (BID) | ORAL | 0 refills | Status: DC | PRN

## 2020-12-07 NOTE — Discharge Summary (Addendum)
Physician Discharge Summary  Jeremy Short GEX:[id number] DOB: 26-Dec-1995 DOA: 12/05/2020  PCP: [facility]  Admit date: 12/05/2020 Discharge date: 12/07/2020  Admitted From: Home Disposition:  Home  Discharge Condition:Stable CODE STATUS:FULL Diet recommendation:  Regular    Brief/Interim Summary:  Patient is a 25 year old male with history of polysubstance use, ongoing use of benzodiazepines as well as heroin, not using IV drugs, on Suboxone at home presented with generalized tonic-clonic seizure for about a minute at home.  Sister brought her to the emergency department for work-up.  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  On presentation he was delusional, postictal, altered.  He was started on IV hydration.  CT head's did not show any acute intracranial normalities.  UDS was positive for cocaine, benzos, amphetamine, THC.  Lactic acid was initially elevated, now normalized.  He was  being managed for benzo withdrawal seizure.  Started on Librium.  Currently he is alert and oriented, hemodynamically stable.  Medically stable for discharge to home today.  I have recommended him to follow-up with his PCP as soon as possible.  Following problems were addressed during his hospitalization:  Benzodiazepine withdrawal seizure: Had history of this in the past as well.  Was admitted for the same in the past.  EEG was negative.  Presented with generalized tonic-clonic seizure, one episode.  Postictal on presentation.  This was most likely benzodiazepine withdrawal seizure. He follows with [facility], he was takes Xanax for PTSD, but was not taking for last few days. Currently he is alert and oriented.  Not in withdrawal.  He needs to continue Xanax at home, he said he has some pills at home,I have prescribed 20 pills.  I have recommended him to follow-up with his primary care physician as soon as possible.   Metabolic encephalopathy: Secondary to  postictal state.  CT head did not show any acute intracranial abnormalities.  Currently he is alert and oriented.   Ongoing opiate use disorder: UDS positive for cocaine, benzo, amphetamine, THC.  We requested for [facility] consult for drug rehabilitation   Leukocytosis: Most likely reactive.  Improved.  Lactic acid was also elevated on presentation most likely from seizure, has resolved      Discharge Diagnoses:  Active Problems:   New onset seizure (HCC)   Toxic metabolic encephalopathy   Withdrawal complaint   Drug addiction (HCC)   Acute drug withdrawal syndrome [facility])    Discharge Instructions  Discharge Instructions     Diet general   Complete by: As directed    Discharge instructions   Complete by: As directed    1)Please follow up with your PCP as soon as possible   Increase activity slowly   Complete by: As directed       Allergies as of 12/07/2020   No Known Allergies      Medication List     STOP taking these medications    cloNIDine 0.1 MG tablet Commonly known as: CATAPRES   gabapentin 300 MG capsule Commonly known as: NEURONTIN       TAKE these medications    ALPRAZolam 1 MG tablet Commonly known as: XANAX Take 1 mg by mouth 2 (two) times daily as needed for anxiety.   nicotine 14 mg/24hr patch Commonly known as: NICODERM CQ - dosed in mg/24 hours Place 1 patch (14 mg total) onto the skin daily.   ondansetron 4 MG tablet Commonly known as: ZOFRAN Take 1 tablet (4 mg total) by mouth  every 6 (six) hours as needed for nausea.   OVER THE COUNTER MEDICATION Apply 1 application topically daily as needed (pain). Icy Hot   Suboxone 8-2 MG Film Generic drug: Buprenorphine HCl-Naloxone HCl Place 1 each under the tongue daily.        Follow-up Information     [facility]. Schedule an appointment as soon as possible for a visit in 1 week(s).   Contact information: [address] [place] [address] [phone]                 No Known Allergies  Consultations: None   Procedures/Studies: CT Head Wo Contrast  Result Date: 12/05/2020 CLINICAL DATA:  Mental status change.  Seizure. EXAM: CT HEAD WITHOUT CONTRAST TECHNIQUE: Contiguous axial images were obtained from the base of the skull through the vertex without intravenous contrast. COMPARISON:  MRI brain and CT head 03/10/2020. FINDINGS: Brain: No evidence of acute infarction, hemorrhage, hydrocephalus, extra-axial collection or mass lesion/mass effect. Vascular: No hyperdense vessel or unexpected calcification. Skull: Normal. Negative for fracture or focal lesion. Sinuses/Orbits: No acute finding. Other: None. IMPRESSION: No acute intracranial abnormality. Electronically Signed   By: Darliss Cheney M.D.   On: 12/05/2020 21:30      Subjective:  Patient seen and examined the bedside this morning.  Medically stable for discharge.  I called his father and discussed about discharge planning   Discharge Exam: Vitals:   12/06/20 2107 12/07/20 0615  BP: 133/84 112/74  Pulse: (!) 45 (!) 45  Resp: 17 18  Temp: 99.9 F (37.7 C) 99.9 F (37.7 C)  SpO2: 100% 99%   Vitals:   12/06/20 0523 12/06/20 1346 12/06/20 2107 12/07/20 0615  BP: 119/82 114/64 133/84 112/74  Pulse: (!) 46 98 (!) 45 (!) 45  Resp: 18 18 17 18   Temp: 98.9 F (37.2 C) 98.4 F (36.9 C) 99.9 F (37.7 C) 99.9 F (37.7 C)  TempSrc: Oral Oral Oral Oral  SpO2: 99%  100% 99%    General: Pt is alert, awake, not in acute distress Cardiovascular: RRR, S1/S2 +, no rubs, no gallops Respiratory: CTA bilaterally, no wheezing, no rhonchi Abdominal: Soft, NT, ND, bowel sounds + Extremities: no edema, no cyanosis    The results of significant diagnostics from this hospitalization (including imaging, microbiology, ancillary and laboratory) are listed below for reference.     Microbiology: Recent Results (from the past 240 hour(s))  Resp Panel by RT-PCR (Flu A&B, Covid) Nasopharyngeal  Swab     Status: None   Collection Time: 12/06/20 12:37 AM   Specimen: Nasopharyngeal Swab; Nasopharyngeal(NP) swabs in vial transport medium  Result Value Ref Range Status   SARS Coronavirus 2 by RT PCR NEGATIVE NEGATIVE Final    Comment: (NOTE) SARS-CoV-2 target nucleic acids are NOT DETECTED.  The SARS-CoV-2 RNA is generally detectable in upper respiratory specimens during the acute phase of infection. The lowest concentration of SARS-CoV-2 viral copies this assay can detect is 138 copies/mL. A negative result does not preclude SARS-Cov-2 infection and should not be used as the sole basis for treatment or other patient management decisions. A negative result may occur with  improper specimen collection/handling, submission of specimen other than nasopharyngeal swab, presence of viral mutation(s) within the areas targeted by this assay, and inadequate number of viral copies(<138 copies/mL). A negative result must be combined with clinical observations, patient history, and epidemiological information. The expected result is Negative.  Fact Sheet for Patients:  12/08/20  Fact Sheet for  Healthcare Providers:  SeriousBroker.it  This test is no t yet approved or cleared by the [place] and  has been authorized for detection and/or diagnosis of SARS-CoV-2 by FDA under an Emergency Use Authorization (EUA). This EUA will remain  in effect (meaning this test can be used) for the duration of the COVID-19 declaration under Section 564(b)(1) of the Act, 21 U.S.C.section 360bbb-3(b)(1), unless the authorization is terminated  or revoked sooner.       Influenza A by PCR NEGATIVE NEGATIVE Final   Influenza B by PCR NEGATIVE NEGATIVE Final    Comment: (NOTE) The Xpert Xpress SARS-CoV-2/FLU/RSV plus assay is intended as an aid in the diagnosis of influenza from Nasopharyngeal swab specimens and should not be used as a sole  basis for treatment. Nasal washings and aspirates are unacceptable for Xpert Xpress SARS-CoV-2/FLU/RSV testing.  Fact Sheet for Patients: BloggerCourse.com  Fact Sheet for Healthcare Providers: SeriousBroker.it  This test is not yet approved or cleared by the [place] FDA and has been authorized for detection and/or diagnosis of SARS-CoV-2 by FDA under an Emergency Use Authorization (EUA). This EUA will remain in effect (meaning this test can be used) for the duration of the COVID-19 declaration under Section 564(b)(1) of the Act, 21 U.S.C. section 360bbb-3(b)(1), unless the authorization is terminated or revoked.  Performed at [facility], [address] [place], [place] [address]      Labs: BNP (last 3 results) No results for input(s): BNP in the last 8760 hours. Basic Metabolic Panel: Recent Labs  Lab 12/05/20 1500  NA 144  K 4.3  CL 103  CO2 26  GLUCOSE 112*  BUN 15  CREATININE 0.96  CALCIUM 9.9   Liver Function Tests: Recent Labs  Lab 12/05/20 1500  AST 20  ALT 16  ALKPHOS 67  BILITOT 1.0  PROT 8.6*  ALBUMIN 5.3*   No results for input(s): LIPASE, AMYLASE in the last 168 hours. No results for input(s): AMMONIA in the last 168 hours. CBC: Recent Labs  Lab 12/05/20 1500 12/06/20 0847  WBC 18.8* 12.7*  NEUTROABS 16.3* 9.6*  HGB 16.5 14.5  HCT 49.7 43.8  MCV 89.2 89.6  PLT 268 224   Cardiac Enzymes: No results for input(s): CKTOTAL, CKMB, CKMBINDEX, TROPONINI in the last 168 hours. BNP: Invalid input(s): POCBNP CBG: No results for input(s): GLUCAP in the last 168 hours. D-Dimer No results for input(s): DDIMER in the last 72 hours. Hgb A1c No results for input(s): HGBA1C in the last 72 hours. Lipid Profile No results for input(s): CHOL, HDL, LDLCALC, TRIG, CHOLHDL, LDLDIRECT in the last 72 hours. Thyroid function studies No results for input(s): TSH, T4TOTAL, T3FREE,  THYROIDAB in the last 72 hours.  Invalid input(s): FREET3 Anemia work up No results for input(s): VITAMINB12, FOLATE, FERRITIN, TIBC, IRON, RETICCTPCT in the last 72 hours. Urinalysis    Component Value Date/Time   COLORURINE AMBER (A) 12/05/2020 1906   APPEARANCEUR CLEAR (A) 12/05/2020 1906   LABSPEC >1.030 (H) 12/05/2020 1906   PHURINE 6.0 12/05/2020 1906   GLUCOSEU NEGATIVE 12/05/2020 1906   HGBUR NEGATIVE 12/05/2020 1906   BILIRUBINUR SMALL (A) 12/05/2020 1906   KETONESUR 40 (A) 12/05/2020 1906   PROTEINUR 30 (A) 12/05/2020 1906   UROBILINOGEN 1.0 07/24/2014 2114   NITRITE NEGATIVE 12/05/2020 1906   LEUKOCYTESUR NEGATIVE 12/05/2020 1906   Sepsis Labs Invalid input(s): PROCALCITONIN,  WBC,  LACTICIDVEN Microbiology Recent Results (from the past 240 hour(s))  Resp Panel by RT-PCR (Flu  A&B, Covid) Nasopharyngeal Swab     Status: None   Collection Time: 12/06/20 12:37 AM   Specimen: Nasopharyngeal Swab; Nasopharyngeal(NP) swabs in vial transport medium  Result Value Ref Range Status   SARS Coronavirus 2 by RT PCR NEGATIVE NEGATIVE Final    Comment: (NOTE) SARS-CoV-2 target nucleic acids are NOT DETECTED.  The SARS-CoV-2 RNA is generally detectable in upper respiratory specimens during the acute phase of infection. The lowest concentration of SARS-CoV-2 viral copies this assay can detect is 138 copies/mL. A negative result does not preclude SARS-Cov-2 infection and should not be used as the sole basis for treatment or other patient management decisions. A negative result may occur with  improper specimen collection/handling, submission of specimen other than nasopharyngeal swab, presence of viral mutation(s) within the areas targeted by this assay, and inadequate number of viral copies(<138 copies/mL). A negative result must be combined with clinical observations, patient history, and epidemiological information. The expected result is Negative.  Fact Sheet for Patients:   BloggerCourse.com  Fact Sheet for Healthcare Providers:  SeriousBroker.it  This test is no t yet approved or cleared by the [place] FDA and  has been authorized for detection and/or diagnosis of SARS-CoV-2 by FDA under an Emergency Use Authorization (EUA). This EUA will remain  in effect (meaning this test can be used) for the duration of the COVID-19 declaration under Section 564(b)(1) of the Act, 21 U.S.C.section 360bbb-3(b)(1), unless the authorization is terminated  or revoked sooner.       Influenza A by PCR NEGATIVE NEGATIVE Final   Influenza B by PCR NEGATIVE NEGATIVE Final    Comment: (NOTE) The Xpert Xpress SARS-CoV-2/FLU/RSV plus assay is intended as an aid in the diagnosis of influenza from Nasopharyngeal swab specimens and should not be used as a sole basis for treatment. Nasal washings and aspirates are unacceptable for Xpert Xpress SARS-CoV-2/FLU/RSV testing.  Fact Sheet for Patients: BloggerCourse.com  Fact Sheet for Healthcare Providers: SeriousBroker.it  This test is not yet approved or cleared by the [place] FDA and has been authorized for detection and/or diagnosis of SARS-CoV-2 by FDA under an Emergency Use Authorization (EUA). This EUA will remain in effect (meaning this test can be used) for the duration of the COVID-19 declaration under Section 564(b)(1) of the Act, 21 U.S.C. section 360bbb-3(b)(1), unless the authorization is terminated or revoked.  Performed at [facility], [address] [place], [place] [address]     Please note: You were cared for by a hospitalist during your hospital stay. Once you are discharged, your primary care physician will handle any further medical issues. Please note that NO REFILLS for any discharge medications will be authorized once you are discharged, as it is imperative that you  return to your primary care physician (or establish a relationship with a primary care physician if you do not have one) for your post hospital discharge needs so that they can reassess your need for medications and monitor your lab values.    Time coordinating discharge: 40 minutes  SIGNED:   Burnadette Pop, MD  [facility] 12/07/2020, 10:15 AM Pager [phone]  If 7PM-7AM, please contact night-coverage [email or web address] Password [email or web address]

## 2020-12-07 NOTE — Progress Notes (Signed)
Reviewed written d/c instructions w pt and his dad and all questions answered. They both verbalized understanding. SW in to see pt and assist w programs for pt. D/ C ambulatory w all belongings in stable condition.

## 2020-12-07 NOTE — TOC Transition Note (Signed)
Transition of Care [facility]) - CM/SW Discharge Note   Patient Details  Name: Jeremy Short MRN: [id number] Date of Birth: 1995-05-15  [facility]) CM/SW Contact:  Mekaila Tarnow, Vinnie Langton, LCSW Phone Number: 12/07/2020, 2:02 PM   Clinical Narrative:      LCSW provided patient with the following list of resources for alcohol and substance abuse treatment:  Alcohol and Drug Services Brochure Risk analyst Brochure [facility] Brochure [facility] [place] Brochure Complete List of Mental Health and Substance Abuse Resources Sobriety Program [employer] Brochure Complete List of Partial Hospitalization Programs AA Meeting Schedule NA Meeting Schedule   Patient Goals and CMS Choice  Patient was receptive to resource information and was attentive, alert and oriented when LCSW reviewed resources with him and his father.  Discharge Placement  None  Discharge Plan and Services    Home with Parents.    Social Determinants of Health (SDOH) Interventions   Alcohol and Substance Abuse Treatment Resources, Inpatient and Outpatient, AA and NA Meeting Schedules.  Readmission Risk Interventions No flowsheet data found.  Danford Bad, BSW, MSW, [employer]  Licensed Visual merchandiser  [employer]  Mailing Address-[address] [place], [place] [address] Physical Address-[address] [place], [place] [address] Toll Free Main # [phone] Fax # [phone] Cell # [phone]  [email or web address]

## 2021-01-27 ENCOUNTER — Other Ambulatory Visit: Payer: Self-pay | Admitting: Neurosurgery

## 2021-02-02 NOTE — Progress Notes (Signed)
Surgical Instructions    Your procedure is scheduled on 02/05/21.  Report to [facility] Main Entrance "A" at 5:30 A.M., then check in with the Admitting office.  Call this number if you have problems the morning of surgery:  [phone]   If you have any questions prior to your surgery date call [phone]: Open Monday-Friday 8am-4pm    Remember:  Do not eat after midnight the night before your surgery  You may drink clear liquids until 4:30 the morning of your surgery.   Clear liquids allowed are: Water, Non-Citrus Juices (without pulp), Carbonated Beverages, Clear Tea, Black Coffee ONLY (NO MILK, CREAM OR POWDERED CREAMER of any kind), and Gatorade    Take these medicines the morning of surgery with A SIP OF WATER:  SUBOXONE ALPRAZolam (XANAX) - as needed   As of today, STOP taking any Aspirin (unless otherwise instructed by your surgeon) Aleve, Naproxen, Ibuprofen, Motrin, Advil, Goody's, BC's, all herbal medications, fish oil, and all vitamins.     After your COVID test   You are not required to quarantine however you are required to wear a well-fitting mask when you are out and around people not in your household.  If your mask becomes wet or soiled, replace with a new one.  Wash your hands often with soap and water for 20 seconds or clean your hands with an alcohol-based hand sanitizer that contains at least 60% alcohol.  Do not share personal items.  Notify your provider: if you are in close contact with someone who has COVID  or if you develop a fever of 100.4 or greater, sneezing, cough, sore throat, shortness of breath or body aches.           Do not wear jewelry  Do not wear lotions, powders, colognes, or deodorant. Do not shave 48 hours prior to surgery.  Men may shave face and neck. Do not bring valuables to the hospital.              [facility] is not responsible for any belongings or valuables.  Do NOT Smoke (Tobacco/Vaping)  24 hours prior to your  procedure  If you use a CPAP at night, you may bring your mask for your overnight stay.   Contacts, glasses, hearing aids, dentures or partials may not be worn into surgery, please bring cases for these belongings   For patients admitted to the hospital, discharge time will be determined by your treatment team.   Patients discharged the day of surgery will not be allowed to drive home, and someone needs to stay with them for 24 hours.  NO VISITORS WILL BE ALLOWED IN PRE-OP WHERE PATIENTS ARE PREPPED FOR SURGERY.  ONLY 1 SUPPORT PERSON MAY BE PRESENT IN THE WAITING ROOM WHILE YOU ARE IN SURGERY.  IF YOU ARE TO BE ADMITTED, ONCE YOU ARE IN YOUR ROOM YOU WILL BE ALLOWED TWO (2) VISITORS. 1 (ONE) VISITOR MAY STAY OVERNIGHT BUT MUST ARRIVE TO THE ROOM BY 8pm.  Minor children may have two parents present. Special consideration for safety and communication needs will be reviewed on a case by case basis.  Special instructions:    Oral Hygiene is also important to reduce your risk of infection.  Remember - BRUSH YOUR TEETH THE MORNING OF SURGERY WITH YOUR REGULAR TOOTHPASTE   - Preparing For Surgery  Before surgery, you can play an important role. Because skin is not sterile, your skin needs to be as free of germs as possible.  You can reduce the number of germs on your skin by washing with CHG (chlorahexidine gluconate) Soap before surgery.  CHG is an antiseptic cleaner which kills germs and bonds with the skin to continue killing germs even after washing.     Please do not use if you have an allergy to CHG or antibacterial soaps. If your skin becomes reddened/irritated stop using the CHG.  Do not shave (including legs and underarms) for at least 48 hours prior to first CHG shower. It is OK to shave your face.  Please follow these instructions carefully.     Shower the NIGHT BEFORE SURGERY and the MORNING OF SURGERY with CHG Soap.   If you chose to wash your hair, wash your hair first  as usual with your normal shampoo. After you shampoo, rinse your hair and body thoroughly to remove the shampoo.  Then [employer] and genitals (private parts) with your normal soap and rinse thoroughly to remove soap.  After that Use CHG Soap as you would any other liquid soap. You can apply CHG directly to the skin and wash gently with a scrungie or a clean washcloth.   Apply the CHG Soap to your body ONLY FROM THE NECK DOWN.  Do not use on open wounds or open sores. Avoid contact with your eyes, ears, mouth and genitals (private parts). Wash Face and genitals (private parts)  with your normal soap.   Wash thoroughly, paying special attention to the area where your surgery will be performed.  Thoroughly rinse your body with warm water from the neck down.  DO NOT shower/wash with your normal soap after using and rinsing off the CHG Soap.  Pat yourself dry with a CLEAN TOWEL.  Wear CLEAN PAJAMAS to bed the night before surgery  Place CLEAN SHEETS on your bed the night before your surgery  DO NOT SLEEP WITH PETS.   Day of Surgery:  Take a shower with CHG soap. Wear Clean/Comfortable clothing the morning of surgery Do not apply any deodorants/lotions.   Remember to brush your teeth WITH YOUR REGULAR TOOTHPASTE.   Please read over the following fact sheets that you were given.

## 2021-02-03 ENCOUNTER — Encounter (HOSPITAL_COMMUNITY): Payer: Self-pay

## 2021-02-03 ENCOUNTER — Encounter (HOSPITAL_COMMUNITY)
Admission: RE | Admit: 2021-02-03 | Discharge: 2021-02-03 | Disposition: A | Discharge status: Home / Self Care | Payer: Medicaid Other | Source: Ambulatory Visit | Attending: Neurosurgery | Admitting: Neurosurgery

## 2021-02-03 ENCOUNTER — Other Ambulatory Visit: Payer: Self-pay

## 2021-02-03 VITALS — BP 134/74 | HR 106 | Temp 98.0°F | Resp 17 | Ht 74.0 in | Wt 171.0 lb

## 2021-02-03 DIAGNOSIS — F1920 Other psychoactive substance dependence, uncomplicated: Secondary | ICD-10-CM | POA: Diagnosis not present

## 2021-02-03 DIAGNOSIS — Z01812 Encounter for preprocedural laboratory examination: Secondary | ICD-10-CM | POA: Diagnosis not present

## 2021-02-03 DIAGNOSIS — Z01818 Encounter for other preprocedural examination: Secondary | ICD-10-CM

## 2021-02-03 DIAGNOSIS — Z20822 Contact with and (suspected) exposure to covid-19: Secondary | ICD-10-CM | POA: Diagnosis not present

## 2021-02-03 HISTORY — DX: Other psychoactive substance abuse, uncomplicated: F19.10

## 2021-02-03 LAB — COMPREHENSIVE METABOLIC PANEL
ALT: 12 U/L (ref 0–44)
AST: 16 U/L (ref 15–41)
Albumin: 4.1 g/dL (ref 3.5–5.0)
Alkaline Phosphatase: 59 U/L (ref 38–126)
Anion gap: 7 (ref 5–15)
BUN: 7 mg/dL (ref 6–20)
CO2: 26 mmol/L (ref 22–32)
Calcium: 8.9 mg/dL (ref 8.9–10.3)
Chloride: 102 mmol/L (ref 98–111)
Creatinine, Ser: 0.78 mg/dL (ref 0.61–1.24)
GFR, Estimated: >60 mL/min (ref >60)
Glucose, Bld: 106 mg/dL — ABNORMAL HIGH (ref 70–99)
Potassium: 4 mmol/L (ref 3.5–5.1)
Sodium: 135 mmol/L (ref 135–145)
Total Bilirubin: 0.7 mg/dL (ref 0.3–1.2)
Total Protein: 6.6 g/dL (ref 6.5–8.1)

## 2021-02-03 LAB — SURGICAL PCR SCREEN
MRSA, PCR: NEGATIVE
Staphylococcus aureus: NEGATIVE

## 2021-02-03 LAB — CBC
HCT: 42.5 % (ref 39.0–52.0)
Hemoglobin: 13.9 g/dL (ref 13.0–17.0)
MCH: 29.5 pg (ref 26.0–34.0)
MCHC: 32.7 g/dL (ref 30.0–36.0)
MCV: 90.2 fL (ref 80.0–100.0)
Platelets: 217 10*3/uL (ref 150–400)
RBC: 4.71 MIL/uL (ref 4.22–5.81)
RDW: 12 % (ref 11.5–15.5)
WBC: 5.7 10*3/uL (ref 4.0–10.5)
nRBC: 0 % (ref 0.0–0.2)

## 2021-02-03 LAB — SARS CORONAVIRUS 2 (TAT 6-24 HRS): SARS Coronavirus 2: NEGATIVE

## 2021-02-03 NOTE — Progress Notes (Signed)
PCP - Pt says he just goes to [facility] (not often) and does not see any specific provider Cardiologist - denies  PPM/ICD - denies   Chest x-ray - 11/03/19 EKG - 12/05/20 Stress Test - denies ECHO - denies Cardiac Cath - denies  Sleep Study - denies   DM- denies  Blood Thinner Instructions: n/a Aspirin Instructions: n/a  ERAS Protcol - no, NPO   COVID TEST- 02/03/21 at PAT   Anesthesia review: no  Patient denies shortness of breath, fever, cough and chest pain at PAT appointment   All instructions explained to the patient, with a verbal understanding of the material. Patient agrees to go over the instructions while at home for a better understanding. Patient also instructed to wear a mask in public after being tested for COVID-19. The opportunity to ask questions was provided.

## 2021-02-16 ENCOUNTER — Other Ambulatory Visit: Payer: Self-pay | Admitting: Neurosurgery

## 2021-02-19 ENCOUNTER — Other Ambulatory Visit (HOSPITAL_COMMUNITY)
Admission: RE | Admit: 2021-02-19 | Discharge: 2021-02-19 | Disposition: A | Discharge status: Home / Self Care | Payer: Medicaid Other | Source: Ambulatory Visit | Attending: Neurosurgery | Admitting: Neurosurgery

## 2021-02-19 DIAGNOSIS — Z20822 Contact with and (suspected) exposure to covid-19: Secondary | ICD-10-CM | POA: Insufficient documentation

## 2021-02-19 DIAGNOSIS — Z01812 Encounter for preprocedural laboratory examination: Secondary | ICD-10-CM | POA: Diagnosis not present

## 2021-02-19 DIAGNOSIS — Z01818 Encounter for other preprocedural examination: Secondary | ICD-10-CM

## 2021-02-19 LAB — SARS CORONAVIRUS 2 (TAT 6-24 HRS): SARS Coronavirus 2: NEGATIVE

## 2021-02-22 ENCOUNTER — Encounter (HOSPITAL_COMMUNITY): Payer: Self-pay | Admitting: Neurosurgery

## 2021-02-22 NOTE — Anesthesia Preprocedure Evaluation (Addendum)
Anesthesia Evaluation  Patient identified by MRN, date of birth, ID band Patient awake    Reviewed: Allergy & Precautions, NPO status , Patient's Chart, lab work & pertinent test results  History of Anesthesia Complications Negative for: history of anesthetic complications  Airway Mallampati: II  TM Distance: >3 FB Neck ROM: Full    Dental  (+) Dental Advisory Given, Teeth Intact   Pulmonary neg pulmonary ROS,    Pulmonary exam normal        Cardiovascular negative cardio ROS Normal cardiovascular exam     Neuro/Psych Seizures -, Well Controlled,  PSYCHIATRIC DISORDERS Anxiety Depression    GI/Hepatic negative GI ROS, (+)     substance abuse  IV drug use,   Endo/Other  negative endocrine ROS  Renal/GU negative Renal ROS     Musculoskeletal negative musculoskeletal ROS (+) narcotic dependent  Abdominal   Peds  Hematology negative hematology ROS (+)   Anesthesia Other Findings On suboxone - off for ~ 2-3 weeks    Reproductive/Obstetrics                            Anesthesia Physical Anesthesia Plan  ASA: 2  Anesthesia Plan: General   Post-op Pain Management: Ketamine IV, Tylenol PO (pre-op) and Celebrex PO (pre-op)   Induction: Intravenous  PONV Risk Score and Plan: 3 and Treatment may vary due to age or medical condition, Ondansetron, Dexamethasone, Midazolam and Scopolamine patch - Pre-op  Airway Management Planned: Oral ETT  Additional Equipment: None  Intra-op Plan:   Post-operative Plan: Extubation in OR  Informed Consent: I have reviewed the patients History and Physical, chart, labs and discussed the procedure including the risks, benefits and alternatives for the proposed anesthesia with the patient or authorized representative who has indicated his/her understanding and acceptance.     Dental advisory given  Plan Discussed with: CRNA and  Anesthesiologist  Anesthesia Plan Comments:        Anesthesia Quick Evaluation

## 2021-02-23 ENCOUNTER — Ambulatory Visit (HOSPITAL_COMMUNITY): Payer: Medicaid Other

## 2021-02-23 ENCOUNTER — Encounter (HOSPITAL_COMMUNITY): Payer: Self-pay | Admitting: Neurosurgery

## 2021-02-23 ENCOUNTER — Ambulatory Visit (HOSPITAL_COMMUNITY): Payer: Medicaid Other | Admitting: Certified Registered Nurse Anesthetist

## 2021-02-23 ENCOUNTER — Other Ambulatory Visit: Payer: Self-pay

## 2021-02-23 ENCOUNTER — Ambulatory Visit (HOSPITAL_COMMUNITY)
Admission: AD | Admit: 2021-02-23 | Discharge: 2021-02-23 | Disposition: A | Discharge status: Home / Self Care | Payer: Medicaid Other | Attending: Neurosurgery | Admitting: Neurosurgery

## 2021-02-23 ENCOUNTER — Encounter (HOSPITAL_COMMUNITY)
Admission: AD | Disposition: A | Discharge status: Home / Self Care | Payer: Self-pay | Source: Home / Self Care | Attending: Neurosurgery

## 2021-02-23 DIAGNOSIS — Z419 Encounter for procedure for purposes other than remedying health state, unspecified: Secondary | ICD-10-CM

## 2021-02-23 DIAGNOSIS — M5117 Intervertebral disc disorders with radiculopathy, lumbosacral region: Secondary | ICD-10-CM | POA: Diagnosis not present

## 2021-02-23 DIAGNOSIS — M48061 Spinal stenosis, lumbar region without neurogenic claudication: Secondary | ICD-10-CM | POA: Diagnosis not present

## 2021-02-23 DIAGNOSIS — M5116 Intervertebral disc disorders with radiculopathy, lumbar region: Secondary | ICD-10-CM | POA: Diagnosis present

## 2021-02-23 HISTORY — PX: LUMBAR LAMINECTOMY/DECOMPRESSION MICRODISCECTOMY: SHX5026

## 2021-02-23 SURGERY — LUMBAR LAMINECTOMY/DECOMPRESSION MICRODISCECTOMY 2 LEVELS
Anesthesia: General | Site: Back | Laterality: Left

## 2021-02-23 MED ORDER — SUGAMMADEX SODIUM 200 MG/2ML IV SOLN
INTRAVENOUS | Status: DC | PRN
  Administered 2021-02-23: 200 mg via INTRAVENOUS

## 2021-02-23 MED ORDER — SCOPOLAMINE 1 MG/3DAYS TD PT72
MEDICATED_PATCH | TRANSDERMAL | Status: AC
  Filled 2021-02-23: qty 1

## 2021-02-23 MED ORDER — PROPOFOL 10 MG/ML IV BOLUS
INTRAVENOUS | Status: AC
  Filled 2021-02-23: qty 20

## 2021-02-23 MED ORDER — ROCURONIUM BROMIDE 10 MG/ML (PF) SYRINGE
PREFILLED_SYRINGE | INTRAVENOUS | Status: AC
  Filled 2021-02-23: qty 10

## 2021-02-23 MED ORDER — FENTANYL CITRATE (PF) 100 MCG/2ML IJ SOLN
25.0000 ug | INTRAMUSCULAR | Status: DC | PRN
  Administered 2021-02-23: 50 ug via INTRAVENOUS

## 2021-02-23 MED ORDER — STERILE WATER FOR IRRIGATION IR SOLN
Status: DC | PRN
  Administered 2021-02-23: 1000 mL

## 2021-02-23 MED ORDER — LIDOCAINE 2% (20 MG/ML) 5 ML SYRINGE
INTRAMUSCULAR | Status: AC
  Filled 2021-02-23: qty 5

## 2021-02-23 MED ORDER — ONDANSETRON HCL 4 MG/2ML IJ SOLN
INTRAMUSCULAR | Status: AC
  Filled 2021-02-23: qty 2

## 2021-02-23 MED ORDER — CELECOXIB 200 MG PO CAPS
200.0000 mg | ORAL_CAPSULE | Freq: Once | ORAL | Status: AC
  Administered 2021-02-23: 200 mg via ORAL
  Filled 2021-02-23: qty 1

## 2021-02-23 MED ORDER — DEXAMETHASONE SODIUM PHOSPHATE 10 MG/ML IJ SOLN
INTRAMUSCULAR | Status: AC
  Filled 2021-02-23: qty 1

## 2021-02-23 MED ORDER — ONDANSETRON HCL 4 MG/2ML IJ SOLN
INTRAMUSCULAR | Status: DC | PRN
  Administered 2021-02-23: 4 mg via INTRAVENOUS

## 2021-02-23 MED ORDER — PHENYLEPHRINE 40 MCG/ML (10ML) SYRINGE FOR IV PUSH (FOR BLOOD PRESSURE SUPPORT)
PREFILLED_SYRINGE | INTRAVENOUS | Status: DC | PRN
  Administered 2021-02-23: 40 ug via INTRAVENOUS
  Administered 2021-02-23 (×4): 80 ug via INTRAVENOUS

## 2021-02-23 MED ORDER — LIDOCAINE 2% (20 MG/ML) 5 ML SYRINGE
INTRAMUSCULAR | Status: DC | PRN
  Administered 2021-02-23: 60 mg via INTRAVENOUS

## 2021-02-23 MED ORDER — METHYLPREDNISOLONE ACETATE 80 MG/ML IJ SUSP
INTRAMUSCULAR | Status: DC | PRN
  Administered 2021-02-23: 80 mg

## 2021-02-23 MED ORDER — KETAMINE HCL 10 MG/ML IJ SOLN
INTRAMUSCULAR | Status: DC | PRN
  Administered 2021-02-23: 30 mg via INTRAVENOUS
  Administered 2021-02-23: 20 mg via INTRAVENOUS

## 2021-02-23 MED ORDER — LIDOCAINE-EPINEPHRINE 1 %-1:100000 IJ SOLN
INTRAMUSCULAR | Status: DC | PRN
  Administered 2021-02-23: 4 mL

## 2021-02-23 MED ORDER — 0.9 % SODIUM CHLORIDE (POUR BTL) OPTIME
TOPICAL | Status: DC | PRN
  Administered 2021-02-23: 1000 mL

## 2021-02-23 MED ORDER — FENTANYL CITRATE (PF) 250 MCG/5ML IJ SOLN
INTRAMUSCULAR | Status: DC | PRN
  Administered 2021-02-23: 100 ug via INTRAVENOUS
  Administered 2021-02-23: 150 ug via INTRAVENOUS

## 2021-02-23 MED ORDER — KETOROLAC TROMETHAMINE 30 MG/ML IJ SOLN
INTRAMUSCULAR | Status: DC | PRN
  Administered 2021-02-23: 30 mg via INTRAVENOUS

## 2021-02-23 MED ORDER — BUPIVACAINE HCL (PF) 0.5 % IJ SOLN
INTRAMUSCULAR | Status: AC
  Filled 2021-02-23: qty 30

## 2021-02-23 MED ORDER — PHENYLEPHRINE 40 MCG/ML (10ML) SYRINGE FOR IV PUSH (FOR BLOOD PRESSURE SUPPORT)
PREFILLED_SYRINGE | INTRAVENOUS | Status: AC
  Filled 2021-02-23: qty 10

## 2021-02-23 MED ORDER — DEXMEDETOMIDINE (PRECEDEX) IN NS 20 MCG/5ML (4 MCG/ML) IV SYRINGE
PREFILLED_SYRINGE | INTRAVENOUS | Status: AC
  Filled 2021-02-23: qty 5

## 2021-02-23 MED ORDER — PROPOFOL 10 MG/ML IV BOLUS
INTRAVENOUS | Status: DC | PRN
  Administered 2021-02-23: 300 mg via INTRAVENOUS

## 2021-02-23 MED ORDER — MIDAZOLAM HCL 2 MG/2ML IJ SOLN
INTRAMUSCULAR | Status: AC
  Filled 2021-02-23: qty 2

## 2021-02-23 MED ORDER — HYDROMORPHONE HCL 1 MG/ML IJ SOLN
INTRAMUSCULAR | Status: DC | PRN
  Administered 2021-02-23 (×2): .25 mg via INTRAVENOUS

## 2021-02-23 MED ORDER — KETAMINE HCL 50 MG/5ML IJ SOSY
PREFILLED_SYRINGE | INTRAMUSCULAR | Status: AC
  Filled 2021-02-23: qty 5

## 2021-02-23 MED ORDER — ACETAMINOPHEN 500 MG PO TABS
1000.0000 mg | ORAL_TABLET | Freq: Once | ORAL | Status: AC
  Administered 2021-02-23: 1000 mg via ORAL
  Filled 2021-02-23: qty 2

## 2021-02-23 MED ORDER — THROMBIN 5000 UNITS EX SOLR: OROMUCOSAL | Status: DC | PRN

## 2021-02-23 MED ORDER — CYCLOBENZAPRINE HCL 10 MG PO TABS: 10.0000 mg | ORAL_TABLET | Freq: Two times a day (BID) | ORAL | 0 refills | Status: DC | PRN

## 2021-02-23 MED ORDER — OXYCODONE HCL 5 MG/5ML PO SOLN: 5.0000 mg | Freq: Once | ORAL | Status: DC | PRN

## 2021-02-23 MED ORDER — EPHEDRINE 5 MG/ML INJ
INTRAVENOUS | Status: AC
  Filled 2021-02-23: qty 5

## 2021-02-23 MED ORDER — FENTANYL CITRATE (PF) 250 MCG/5ML IJ SOLN
INTRAMUSCULAR | Status: AC
  Filled 2021-02-23: qty 5

## 2021-02-23 MED ORDER — HYDROMORPHONE HCL 1 MG/ML IJ SOLN
INTRAMUSCULAR | Status: AC
  Filled 2021-02-23: qty 0.5

## 2021-02-23 MED ORDER — CHLORHEXIDINE GLUCONATE CLOTH 2 % EX PADS: 6.0000 | MEDICATED_PAD | Freq: Once | CUTANEOUS | Status: DC

## 2021-02-23 MED ORDER — MIDAZOLAM HCL 2 MG/2ML IJ SOLN
INTRAMUSCULAR | Status: DC | PRN
  Administered 2021-02-23: 2 mg via INTRAVENOUS

## 2021-02-23 MED ORDER — CEFAZOLIN SODIUM-DEXTROSE 2-4 GM/100ML-% IV SOLN
2.0000 g | INTRAVENOUS | Status: AC
  Administered 2021-02-23: 2 g via INTRAVENOUS
  Filled 2021-02-23: qty 100

## 2021-02-23 MED ORDER — BUPIVACAINE HCL (PF) 0.5 % IJ SOLN
INTRAMUSCULAR | Status: DC | PRN
  Administered 2021-02-23: 4 mL

## 2021-02-23 MED ORDER — THROMBIN 5000 UNITS EX SOLR
CUTANEOUS | Status: AC
  Filled 2021-02-23: qty 15000

## 2021-02-23 MED ORDER — FENTANYL CITRATE (PF) 100 MCG/2ML IJ SOLN
INTRAMUSCULAR | Status: AC
  Filled 2021-02-23: qty 2

## 2021-02-23 MED ORDER — SCOPOLAMINE 1 MG/3DAYS TD PT72
MEDICATED_PATCH | TRANSDERMAL | Status: DC | PRN
  Administered 2021-02-23: 1 via TRANSDERMAL

## 2021-02-23 MED ORDER — CHLORHEXIDINE GLUCONATE 0.12 % MT SOLN
15.0000 mL | Freq: Once | OROMUCOSAL | Status: AC
  Administered 2021-02-23: 15 mL via OROMUCOSAL
  Filled 2021-02-23: qty 15

## 2021-02-23 MED ORDER — LIDOCAINE-EPINEPHRINE 1 %-1:100000 IJ SOLN
INTRAMUSCULAR | Status: AC
  Filled 2021-02-23: qty 1

## 2021-02-23 MED ORDER — PROMETHAZINE HCL 25 MG/ML IJ SOLN: 6.2500 mg | INTRAMUSCULAR | Status: DC | PRN

## 2021-02-23 MED ORDER — PHENYLEPHRINE HCL-NACL 20-0.9 MG/250ML-% IV SOLN
INTRAVENOUS | Status: DC | PRN
  Administered 2021-02-23: 25 ug/min via INTRAVENOUS

## 2021-02-23 MED ORDER — DEXMEDETOMIDINE (PRECEDEX) IN NS 20 MCG/5ML (4 MCG/ML) IV SYRINGE
PREFILLED_SYRINGE | INTRAVENOUS | Status: DC | PRN
  Administered 2021-02-23: 20 ug via INTRAVENOUS

## 2021-02-23 MED ORDER — OXYCODONE HCL 5 MG PO TABS: 5.0000 mg | ORAL_TABLET | Freq: Once | ORAL | Status: DC | PRN

## 2021-02-23 MED ORDER — THROMBIN 5000 UNITS EX SOLR
CUTANEOUS | Status: DC | PRN
  Administered 2021-02-23: 10000 [IU] via TOPICAL

## 2021-02-23 MED ORDER — DEXAMETHASONE SODIUM PHOSPHATE 10 MG/ML IJ SOLN
INTRAMUSCULAR | Status: DC | PRN
  Administered 2021-02-23: 10 mg via INTRAVENOUS

## 2021-02-23 MED ORDER — ORAL CARE MOUTH RINSE: 15.0000 mL | Freq: Once | OROMUCOSAL | Status: AC

## 2021-02-23 MED ORDER — OXYCODONE-ACETAMINOPHEN 5-325 MG PO TABS: 1.0000 | ORAL_TABLET | Freq: Four times a day (QID) | ORAL | 0 refills | Status: AC | PRN

## 2021-02-23 MED ORDER — METHYLPREDNISOLONE ACETATE 80 MG/ML IJ SUSP
INTRAMUSCULAR | Status: AC
  Filled 2021-02-23: qty 1

## 2021-02-23 MED ORDER — KETOROLAC TROMETHAMINE 30 MG/ML IJ SOLN
INTRAMUSCULAR | Status: AC
  Filled 2021-02-23: qty 1

## 2021-02-23 MED ORDER — LACTATED RINGERS IV SOLN: INTRAVENOUS | Status: DC

## 2021-02-23 MED ORDER — ROCURONIUM BROMIDE 10 MG/ML (PF) SYRINGE
PREFILLED_SYRINGE | INTRAVENOUS | Status: DC | PRN
  Administered 2021-02-23: 60 mg via INTRAVENOUS
  Administered 2021-02-23: 20 mg via INTRAVENOUS

## 2021-02-23 SURGICAL SUPPLY — 57 items
ADH SKN CLS APL DERMABOND .7 (GAUZE/BANDAGES/DRESSINGS) ×1
APL SKNCLS STERI-STRIP NONHPOA (GAUZE/BANDAGES/DRESSINGS)
BAG COUNTER SPONGE SURGICOUNT (BAG) ×2 IMPLANT
BAG SPNG CNTER NS LX DISP (BAG) ×1
BAND INSRT 18 STRL LF DISP RB (MISCELLANEOUS) ×2
BAND RUBBER #18 3X1/16 STRL (MISCELLANEOUS) ×4 IMPLANT
BENZOIN TINCTURE PRP APPL 2/3 (GAUZE/BANDAGES/DRESSINGS) IMPLANT
BLADE CLIPPER SURG (BLADE) IMPLANT
BLADE SURG 11 STRL SS (BLADE) ×2 IMPLANT
BUR MATCHSTICK NEURO 3.0 LAGG (BURR) ×2 IMPLANT
BUR PRECISION FLUTE 5.0 (BURR) IMPLANT
CANISTER SUCT 3000ML PPV (MISCELLANEOUS) ×2 IMPLANT
CARTRIDGE OIL MAESTRO DRILL (MISCELLANEOUS) ×1 IMPLANT
DECANTER SPIKE VIAL GLASS SM (MISCELLANEOUS) ×2 IMPLANT
DERMABOND ADVANCED (GAUZE/BANDAGES/DRESSINGS) ×1
DERMABOND ADVANCED .7 DNX12 (GAUZE/BANDAGES/DRESSINGS) ×1 IMPLANT
DIFFUSER DRILL AIR PNEUMATIC (MISCELLANEOUS) ×2 IMPLANT
DRAPE LAPAROTOMY 100X72X124 (DRAPES) ×2 IMPLANT
DRAPE MICROSCOPE LEICA (MISCELLANEOUS) ×2 IMPLANT
DRAPE SURG 17X23 STRL (DRAPES) ×2 IMPLANT
DURAPREP 26ML APPLICATOR (WOUND CARE) ×2 IMPLANT
ELECT REM PT RETURN 9FT ADLT (ELECTROSURGICAL) ×2
ELECTRODE REM PT RTRN 9FT ADLT (ELECTROSURGICAL) ×1 IMPLANT
GAUZE 4X4 16PLY ~~LOC~~+RFID DBL (SPONGE) IMPLANT
GAUZE SPONGE 4X4 12PLY STRL (GAUZE/BANDAGES/DRESSINGS) IMPLANT
GLOVE EXAM NITRILE XL STR (GLOVE) IMPLANT
GLOVE SURG ENC MOIS LTX SZ7.5 (GLOVE) IMPLANT
GLOVE SURG LTX SZ7 (GLOVE) ×2 IMPLANT
GLOVE SURG UNDER POLY LF SZ7.5 (GLOVE) ×4 IMPLANT
GOWN STRL REUS W/ TWL LRG LVL3 (GOWN DISPOSABLE) ×2 IMPLANT
GOWN STRL REUS W/ TWL XL LVL3 (GOWN DISPOSABLE) IMPLANT
GOWN STRL REUS W/TWL 2XL LVL3 (GOWN DISPOSABLE) IMPLANT
GOWN STRL REUS W/TWL LRG LVL3 (GOWN DISPOSABLE) ×4
GOWN STRL REUS W/TWL XL LVL3 (GOWN DISPOSABLE)
HEMOSTAT POWDER KIT SURGIFOAM (HEMOSTASIS) ×2 IMPLANT
KIT BASIN OR (CUSTOM PROCEDURE TRAY) ×2 IMPLANT
KIT TURNOVER KIT B (KITS) ×2 IMPLANT
NDL HYPO 18GX1.5 BLUNT FILL (NEEDLE) IMPLANT
NDL SPNL 18GX3.5 QUINCKE PK (NEEDLE) IMPLANT
NEEDLE HYPO 18GX1.5 BLUNT FILL (NEEDLE) IMPLANT
NEEDLE HYPO 22GX1.5 SAFETY (NEEDLE) ×2 IMPLANT
NEEDLE SPNL 18GX3.5 QUINCKE PK (NEEDLE) IMPLANT
NS IRRIG 1000ML POUR BTL (IV SOLUTION) ×2 IMPLANT
OIL CARTRIDGE MAESTRO DRILL (MISCELLANEOUS) ×2
PACK LAMINECTOMY NEURO (CUSTOM PROCEDURE TRAY) ×2 IMPLANT
PAD ARMBOARD 7.5X6 YLW CONV (MISCELLANEOUS) ×6 IMPLANT
SPONGE SURGIFOAM ABS GEL SZ50 (HEMOSTASIS) ×2 IMPLANT
SPONGE T-LAP 4X18 ~~LOC~~+RFID (SPONGE) IMPLANT
STRIP CLOSURE SKIN 1/2X4 (GAUZE/BANDAGES/DRESSINGS) IMPLANT
SUT VIC AB 0 CT1 18XCR BRD8 (SUTURE) ×1 IMPLANT
SUT VIC AB 0 CT1 8-18 (SUTURE) ×2
SUT VIC AB 2-0 CT1 18 (SUTURE) IMPLANT
SUT VICRYL 3-0 RB1 18 ABS (SUTURE) ×4 IMPLANT
SYR 3ML LL SCALE MARK (SYRINGE) IMPLANT
TOWEL GREEN STERILE (TOWEL DISPOSABLE) ×2 IMPLANT
TOWEL GREEN STERILE FF (TOWEL DISPOSABLE) ×2 IMPLANT
WATER STERILE IRR 1000ML POUR (IV SOLUTION) ×2 IMPLANT

## 2021-02-23 NOTE — Anesthesia Procedure Notes (Signed)
Procedure Name: Intubation Date/Time: 02/23/2021 7:51 AM Performed by: Carolan Clines, CRNA Pre-anesthesia Checklist: Patient identified, Emergency Drugs available, Suction available and Patient being monitored Patient Re-evaluated:Patient Re-evaluated prior to induction Oxygen Delivery Method: Circle System Utilized Preoxygenation: Pre-oxygenation with 100% oxygen Induction Type: IV induction Ventilation: Mask ventilation without difficulty Laryngoscope Size: Mac and 4 Grade View: Grade I Tube type: Oral Tube size: 7.5 mm Number of attempts: 1 Airway Equipment and Method: Stylet Placement Confirmation: ETT inserted through vocal cords under direct vision, positive ETCO2 and breath sounds checked- equal and bilateral Secured at: 22 cm Tube secured with: Tape Dental Injury: Teeth and Oropharynx as per pre-operative assessment

## 2021-02-23 NOTE — Anesthesia Postprocedure Evaluation (Signed)
Anesthesia Post Note  Patient: Jeremy Short  Procedure(s) Performed: LAMINOTOMY, MICRODISCECTOMY, L45, L5S1 (Left: Back)     Patient location during evaluation: PACU Anesthesia Type: General Level of consciousness: awake and alert Pain management: pain level controlled Vital Signs Assessment: post-procedure vital signs reviewed and stable Respiratory status: spontaneous breathing, nonlabored ventilation and respiratory function stable Cardiovascular status: stable and blood pressure returned to baseline Anesthetic complications: no   No notable events documented.  Last Vitals:  Vitals:   02/23/21 1040 02/23/21 1055  BP: 130/81 115/81  Pulse: 95 93  Resp: 11   Temp: 37.1 C   SpO2: 100% 100%    Last Pain:  Vitals:   02/23/21 1040  TempSrc:   PainSc: 4                  Beryle Lathe

## 2021-02-23 NOTE — Discharge Summary (Signed)
  Physician Discharge Summary  Patient ID: Jeremy Short MRN: [id number] DOB/AGE: [birth year] 25 y.o.  Admit date: 02/23/2021 Discharge date: 02/23/2021  Admission Diagnoses: Lumbar disc herniation with radiculopathy, L4-5, L5-S1  Discharge Diagnoses: Same Active Problems:   * No active hospital problems. *   Discharged Condition: Stable  Hospital Course:  [name] is a 25 y.o. male who underwent elective L4-5, L5-S1 laminotomy and microdiscectomy which was done without complication.  Postoperatively the patient was at neurologic baseline. Back pain was controlled with oral medication, he was ambulating without difficulty, voiding normally, and tolerating diet.  Treatments: Surgery -left L4-5, L5-S1 laminotomy, microdiscectomy  Discharge Exam: Blood pressure 122/83, pulse (!) 105, temperature 98.2 F (36.8 C), temperature source Oral, resp. rate 18, height 6\' 2"  (1.88 m), weight 80.7 kg, SpO2 98 %. Awake, alert, oriented Speech fluent, appropriate CN grossly intact 5/5 BUE/BLE Wound c/d/i  Follow-up: Follow-up in my office [facility] [phone]) in 2-3 weeks  Disposition: Discharge disposition: 01-Home or Self Care      Discharge Instructions     Call MD for:  redness, tenderness, or signs of infection (pain, swelling, redness, odor or green/yellow discharge around incision site)   Complete by: As directed    Call MD for:  temperature >100.4   Complete by: As directed    Diet - low sodium heart healthy   Complete by: As directed    Discharge instructions   Complete by: As directed    Walk at home as much as possible, at least 4 times / day   Increase activity slowly   Complete by: As directed    Lifting restrictions   Complete by: As directed    No lifting > 10 lbs   May shower / Bathe   Complete by: As directed    48 hours after surgery   May walk up steps   Complete by: As directed    Other Restrictions   Complete by:  As directed    No bending/twisting at waist   Remove dressing in 48 hours   Complete by: As directed       Allergies as of 02/23/2021   No Known Allergies      Medication List     STOP taking these medications    nicotine 14 mg/24hr patch Commonly known as: NICODERM CQ - dosed in mg/24 hours   ondansetron 4 MG tablet Commonly known as: ZOFRAN       TAKE these medications    ALPRAZolam 1 MG tablet Commonly known as: XANAX Take 1 tablet (1 mg total) by mouth 2 (two) times daily as needed for anxiety.   cyclobenzaprine 10 MG tablet Commonly known as: FLEXERIL Take 1 tablet (10 mg total) by mouth 2 (two) times daily as needed for muscle spasms.   ICY HOT EX Apply 1 application topically daily as needed (pain).   oxyCODONE-acetaminophen 5-325 MG tablet Commonly known as: Percocet Take 1 tablet by mouth every 6 (six) hours as needed for up to 7 days for severe pain.   Suboxone 8-2 MG Film Generic drug: Buprenorphine HCl-Naloxone HCl Place 0.5 each under the tongue daily.          Signed14/07/2020 02/23/2021, 10:00 AM

## 2021-02-23 NOTE — Transfer of Care (Signed)
Immediate Anesthesia Transfer of Care Note  Patient: Jeremy Short  Procedure(s) Performed: LAMINOTOMY, MICRODISCECTOMY, L45, L5S1 (Left: Back)  Patient Location: PACU  Anesthesia Type:General  Level of Consciousness: awake, alert  and oriented  Airway & Oxygen Therapy: Patient Spontanous Breathing  Post-op Assessment: Report given to RN and Post -op Vital signs reviewed and stable  Post vital signs: Reviewed and stable  Last Vitals:  Vitals Value Taken Time  BP 127/65 02/23/21 1010  Temp 37.1 C 02/23/21 1010  Pulse 91 02/23/21 1010  Resp 15 02/23/21 1010  SpO2 100 % 02/23/21 1010  Vitals shown include unvalidated device data.  Last Pain:  Vitals:   02/23/21 0641  TempSrc:   PainSc: 8       Patients Stated Pain Goal: 2 (02/23/21 0641)  Complications: No notable events documented.

## 2021-02-23 NOTE — Op Note (Signed)
NEUROSURGERY OPERATIVE NOTE   PREOP DIAGNOSIS: Lumbar disc herniation, L4-5, L5-S1  POSTOP DIAGNOSIS: Same  PROCEDURE: 1. Left L4-5, L5-S1 laminotomy and microdiscectomy for decompression of nerve root 2. Use of operating microscope  SURGEON: Dr. Lisbeth Renshaw, MD  ASSISTANT: Dr. Coletta Memos, MD  ANESTHESIA: General Endotracheal  EBL: 25cc  SPECIMENS: None  DRAINS: None  COMPLICATIONS: None immediate  CONDITION: Hemodynamically stable to PACU  HISTORY: Jeremy Short is a 25 y.o. male followed initially in the outpatient neurosurgery clinic with relatively chronic left-sided back and leg pain.  His imaging has revealed broad-based disc bulge slightly eccentric to the left at L4-5 and L5-S1 with associated stenosis.  Patient attempted multiple different conservative treatments including medical therapy and injection therapy with partial improvement but continued symptoms limiting normal daily activities.  He therefore elected to proceed with surgical decompression.  The risks, benefits, and alternatives to surgery were all reviewed in detail with the patient.  After all his questions were answered informed consent was obtained and witnessed.  PROCEDURE IN DETAIL: After informed consent was obtained and witnessed, the patient was brought to the operating room. After induction of general anesthesia, the patient was positioned on the operative table in the prone position with all pressure points meticulously padded. The skin of the low back was then prepped and draped in the usual sterile fashion.  After timeout was conducted, the skin was infiltrated with local anesthetic.  Spinal needle was introduced and lateral x-ray was taken to identify the surface projection of the L4-5 and L5-S1 interspaces.  Skin incision was then made sharply and Bovie electrocautery was used to dissect the subcutaneous tissue until the lumbodorsal fascia was identified. The fashion was then incised  using Bovie electrocautery and the lamina at the L4 and L5 levels was identified and dissection was carried out in the subperiosteal plane. Self-retaining retractor was then placed, and intraoperative x-ray was taken with a dissector within the L4-5 interspace to confirm our location.  Using a high-speed drill, laminotomy on the left at L4 and L5 was completed.  The ligamentum flavum was identified.  Using a small hockey-stick dissector, the ligamentum flavum was detached from its superior attachment on L4 and the ligamentum flavum was removed piecemeal with Kerrison punches.  High-speed drill was used to remove a few millimeters of the medial aspect of the L4-5 facet complex.  Lateral edge of the thecal sac and the traversing left L5 nerve root was identified.  Blunt dissection was carried out in the ventral epidural space.  A broad-based disc bulge was identified.  Disc space was then incised and using a combination of curettes and rongeurs, discectomy was completed.  Once this was done, I was able to easily pass the ball-tipped dissector underneath the left L5 nerve root and out the foramen indicating good decompression.  Hemostasis at this level was secured with morselized Gelfoam with thrombin.  A one by one cottonoid was then placed and attention was turned to the more inferior disc space.  In a similar fashion, the hockey-stick dissector was used to detach the ligamentum flavum from its superior attachment of L5.  The ligamentum flavum was then removed piecemeal with Kerrison punches.  The lateral edge of the thecal sac was identified.  There was a significant amount of epidural lipomatosis which was removed with bipolar electrocautery.  The traversing left S1 nerve root was identified.  Dissection was again carried out in the ventral epidural space.  I did use a high-speed drill to  remove the medial portion of the left L5-S1 facet complex.  I then identified a relatively large subligamentous disc  herniation.  Disc space was incised and multiple large disc fragments were removed.  Once this was done the left S1 nerve root in the lateral edge of the thecal sac took up a much more normal position.  I was able to then freely pass a small ball dissector within the ventral epidural space and underneath the left S1 nerve root indicating good decompression.  Hemostasis was easily secured with morselized Gelfoam with thrombin.  The wound was then irrigated with normal saline.  Nerve roots at L4-5 and L5-S1 were then covered with Depo-Medrol.  Self-retaining retractor was then removed, and the wound is closed in layers using a combination of interrupted 0 Vicryl and 3-0 Vicryl stitches. The skin was closed using standard skin glue.  At the end of the case all sponge, needle, and instrument counts were correct. The patient was then transferred to the stretcher and taken to the postanesthesia care unit in stable hemodynamic condition.   Lisbeth Renshaw, MD [facility]

## 2021-02-23 NOTE — H&P (Signed)
Chief Complaint  Back and leg pain  History of Present Illness  Jeremy Short is a 25 y.o. male initially seen in the outpatient neurosurgery clinic where we have been managing left-sided back and leg pain.  He initially presented several months ago with left-sided back and leg pain worse with standing and walking.  His imaging has revealed a large disc herniation at L4-5 and L5-S1 with left-sided stenosis.  He has attempted multiple conservative treatments including epidural steroid injections with only partial improvement in his pain.  He therefore elected to proceed with surgical decompression.  Past Medical History   Past Medical History:  Diagnosis Date   Anxiety 2018   Depression    Substance abuse (HCC)    hx or heroin use, still occasionally takes pain medication    Past Surgical History  History reviewed. No pertinent surgical history.  Social History   Social History   Tobacco Use   Smoking status: Never   Smokeless tobacco: Never  Vaping Use   Vaping Use: Every day  Substance Use Topics   Alcohol use: Not Currently   Drug use: Yes    Types: Marijuana, Oxycodone    Comment: almost every day    Medications   Prior to Admission medications   Medication Sig Start Date End Date Taking? Authorizing Provider  ALPRAZolam Prudy Feeler) 1 MG tablet Take 1 tablet (1 mg total) by mouth 2 (two) times daily as needed for anxiety. 12/07/20  Yes Adhikari, Willia Craze, MD  SUBOXONE 8-2 MG FILM Place 0.5 each under the tongue daily. 01/09/20  Yes [provider]  Menthol, Topical Analgesic, (ICY HOT EX) Apply 1 application topically daily as needed (pain).    [provider]  nicotine (NICODERM CQ - DOSED IN MG/24 HOURS) 14 mg/24hr patch Place 1 patch (14 mg total) onto the skin daily. Patient not taking: No sig reported 12/07/20   Burnadette Pop, MD  ondansetron (ZOFRAN) 4 MG tablet Take 1 tablet (4 mg total) by mouth every 6 (six) hours as needed for nausea. Patient  not taking: No sig reported 12/07/20   Burnadette Pop, MD    Allergies  No Known Allergies  Review of Systems  ROS  Neurologic Exam  Awake, alert, oriented Memory and concentration grossly intact Speech fluent, appropriate CN grossly intact Motor exam: Upper Extremities Deltoid Bicep Tricep Grip  Right 5/5 5/5 5/5 5/5  Left 5/5 5/5 5/5 5/5   Lower Extremities IP Quad PF DF EHL  Right 5/5 5/5 5/5 5/5 5/5  Left 5/5 5/5 5/5 5/5 5/5   Sensation grossly intact to LT  Imaging  MRI of the lumbar spine dated 09/24/2020 was personally reviewed and demonstrates primary findings at L5-S1 and L4-5.  L4-5 there is a relatively broad-based left eccentric disc bulge with left-sided lateral recess stenosis.  At L5-S1 there is a relatively broad-based central disc protrusion.  Impression  - 25 y.o. male with back and left leg pain likely related to left-sided disc bulges at L4-5 and L5-S1  Plan  -We will plan on proceeding with left L4-5 and L5-S1 laminotomy and microdiscectomy  I have reviewed the treatment options at length with the patient in the office.  We have discussed the details of surgery as well as the expected postoperative course and recovery.  We have also reviewed the associated risks, benefits, and alternatives to surgery.  All his questions today were answered.  He provided informed consent to proceed.   Lisbeth Renshaw, MD [facility]

## 2021-02-24 ENCOUNTER — Encounter (HOSPITAL_COMMUNITY): Payer: Self-pay | Admitting: Neurosurgery

## 2021-03-15 ENCOUNTER — Inpatient Hospital Stay (HOSPITAL_COMMUNITY)
Admission: EM | Admit: 2021-03-15 | Discharge: 2021-03-20 | DRG: 100 | Disposition: A | Discharge status: Home / Self Care | Payer: Medicaid Other | Attending: Internal Medicine | Admitting: Internal Medicine

## 2021-03-15 ENCOUNTER — Encounter (HOSPITAL_COMMUNITY): Payer: Self-pay | Admitting: Radiology

## 2021-03-15 ENCOUNTER — Emergency Department (HOSPITAL_COMMUNITY): Payer: Medicaid Other

## 2021-03-15 DIAGNOSIS — F411 Generalized anxiety disorder: Secondary | ICD-10-CM | POA: Diagnosis present

## 2021-03-15 DIAGNOSIS — F32A Depression, unspecified: Secondary | ICD-10-CM | POA: Diagnosis present

## 2021-03-15 DIAGNOSIS — E872 Acidosis, unspecified: Secondary | ICD-10-CM

## 2021-03-15 DIAGNOSIS — M5127 Other intervertebral disc displacement, lumbosacral region: Secondary | ICD-10-CM | POA: Diagnosis present

## 2021-03-15 DIAGNOSIS — Y848 Other medical procedures as the cause of abnormal reaction of the patient, or of later complication, without mention of misadventure at the time of the procedure: Secondary | ICD-10-CM | POA: Diagnosis not present

## 2021-03-15 DIAGNOSIS — T859XXA Unspecified complication of internal prosthetic device, implant and graft, initial encounter: Secondary | ICD-10-CM | POA: Diagnosis not present

## 2021-03-15 DIAGNOSIS — R5600 Simple febrile convulsions: Principal | ICD-10-CM | POA: Diagnosis present

## 2021-03-15 DIAGNOSIS — G039 Meningitis, unspecified: Secondary | ICD-10-CM

## 2021-03-15 DIAGNOSIS — R600 Localized edema: Secondary | ICD-10-CM | POA: Diagnosis present

## 2021-03-15 DIAGNOSIS — Z79899 Other long term (current) drug therapy: Secondary | ICD-10-CM

## 2021-03-15 DIAGNOSIS — R291 Meningismus: Secondary | ICD-10-CM | POA: Diagnosis present

## 2021-03-15 DIAGNOSIS — R32 Unspecified urinary incontinence: Secondary | ICD-10-CM | POA: Diagnosis present

## 2021-03-15 DIAGNOSIS — F1910 Other psychoactive substance abuse, uncomplicated: Secondary | ICD-10-CM

## 2021-03-15 DIAGNOSIS — F17290 Nicotine dependence, other tobacco product, uncomplicated: Secondary | ICD-10-CM | POA: Diagnosis present

## 2021-03-15 DIAGNOSIS — Z9889 Other specified postprocedural states: Secondary | ICD-10-CM

## 2021-03-15 DIAGNOSIS — I82622 Acute embolism and thrombosis of deep veins of left upper extremity: Secondary | ICD-10-CM | POA: Diagnosis not present

## 2021-03-15 DIAGNOSIS — Y92239 Unspecified place in hospital as the place of occurrence of the external cause: Secondary | ICD-10-CM | POA: Diagnosis not present

## 2021-03-15 DIAGNOSIS — R112 Nausea with vomiting, unspecified: Secondary | ICD-10-CM

## 2021-03-15 DIAGNOSIS — R4182 Altered mental status, unspecified: Secondary | ICD-10-CM | POA: Diagnosis not present

## 2021-03-15 DIAGNOSIS — F419 Anxiety disorder, unspecified: Secondary | ICD-10-CM | POA: Diagnosis present

## 2021-03-15 DIAGNOSIS — R509 Fever, unspecified: Secondary | ICD-10-CM

## 2021-03-15 DIAGNOSIS — G9341 Metabolic encephalopathy: Secondary | ICD-10-CM | POA: Diagnosis present

## 2021-03-15 DIAGNOSIS — A419 Sepsis, unspecified organism: Secondary | ICD-10-CM | POA: Diagnosis present

## 2021-03-15 DIAGNOSIS — R569 Unspecified convulsions: Secondary | ICD-10-CM | POA: Diagnosis not present

## 2021-03-15 DIAGNOSIS — R6510 Systemic inflammatory response syndrome (SIRS) of non-infectious origin without acute organ dysfunction: Secondary | ICD-10-CM | POA: Diagnosis present

## 2021-03-15 DIAGNOSIS — D72829 Elevated white blood cell count, unspecified: Secondary | ICD-10-CM | POA: Diagnosis not present

## 2021-03-15 DIAGNOSIS — Z20822 Contact with and (suspected) exposure to covid-19: Secondary | ICD-10-CM | POA: Diagnosis present

## 2021-03-15 DIAGNOSIS — R609 Edema, unspecified: Secondary | ICD-10-CM | POA: Diagnosis not present

## 2021-03-15 LAB — COMPREHENSIVE METABOLIC PANEL
ALT: 56 U/L — ABNORMAL HIGH (ref 0–44)
AST: 37 U/L (ref 15–41)
Albumin: 4.7 g/dL (ref 3.5–5.0)
Alkaline Phosphatase: 76 U/L (ref 38–126)
Anion gap: 13 (ref 5–15)
BUN: 20 mg/dL (ref 6–20)
CO2: 23 mmol/L (ref 22–32)
Calcium: 9.9 mg/dL (ref 8.9–10.3)
Chloride: 100 mmol/L (ref 98–111)
Creatinine, Ser: 0.99 mg/dL (ref 0.61–1.24)
GFR, Estimated: >60 mL/min (ref >60)
Glucose, Bld: 116 mg/dL — ABNORMAL HIGH (ref 70–99)
Potassium: 3.9 mmol/L (ref 3.5–5.1)
Sodium: 136 mmol/L (ref 135–145)
Total Bilirubin: 1.1 mg/dL (ref 0.3–1.2)
Total Protein: 8.7 g/dL — ABNORMAL HIGH (ref 6.5–8.1)

## 2021-03-15 LAB — URINALYSIS, ROUTINE W REFLEX MICROSCOPIC
Bacteria, UA: NONE SEEN
Bilirubin Urine: NEGATIVE
Glucose, UA: NEGATIVE mg/dL
Hgb urine dipstick: NEGATIVE
Ketones, ur: 20 mg/dL — AB
Leukocytes,Ua: NEGATIVE
Nitrite: NEGATIVE
Protein, ur: NEGATIVE mg/dL
Specific Gravity, Urine: 1.03 (ref 1.005–1.030)
pH: 6 (ref 5.0–8.0)

## 2021-03-15 LAB — ACETAMINOPHEN LEVEL: Acetaminophen (Tylenol), Serum: <10 ug/mL — ABNORMAL LOW (ref 10–30)

## 2021-03-15 LAB — PROTIME-INR
INR: 1.1 (ref 0.8–1.2)
Prothrombin Time: 13.8 seconds (ref 11.4–15.2)

## 2021-03-15 LAB — CBC WITH DIFFERENTIAL/PLATELET
Abs Immature Granulocytes: 0.08 10*3/uL — ABNORMAL HIGH (ref 0.00–0.07)
Basophils Absolute: 0 10*3/uL (ref 0.0–0.1)
Basophils Relative: 0 %
Eosinophils Absolute: 0 10*3/uL (ref 0.0–0.5)
Eosinophils Relative: 0 %
HCT: 46.2 % (ref 39.0–52.0)
Hemoglobin: 15.6 g/dL (ref 13.0–17.0)
Immature Granulocytes: 0 %
Lymphocytes Relative: 5 %
Lymphs Abs: 0.8 10*3/uL (ref 0.7–4.0)
MCH: 29.3 pg (ref 26.0–34.0)
MCHC: 33.8 g/dL (ref 30.0–36.0)
MCV: 86.8 fL (ref 80.0–100.0)
Monocytes Absolute: 1 10*3/uL (ref 0.1–1.0)
Monocytes Relative: 6 %
Neutro Abs: 16 10*3/uL — ABNORMAL HIGH (ref 1.7–7.7)
Neutrophils Relative %: 89 %
Platelets: 310 10*3/uL (ref 150–400)
RBC: 5.32 MIL/uL (ref 4.22–5.81)
RDW: 12.4 % (ref 11.5–15.5)
WBC: 18 10*3/uL — ABNORMAL HIGH (ref 4.0–10.5)
nRBC: 0 % (ref 0.0–0.2)

## 2021-03-15 LAB — LACTIC ACID, PLASMA
Lactic Acid, Venous: 2.4 mmol/L (ref 0.5–1.9)
Lactic Acid, Venous: 1.9 mmol/L (ref 0.5–1.9)

## 2021-03-15 LAB — SEDIMENTATION RATE: Sed Rate: 7 mm/hr (ref 0–16)

## 2021-03-15 LAB — RAPID URINE DRUG SCREEN, HOSP PERFORMED
Amphetamines: NOT DETECTED
Barbiturates: NOT DETECTED
Benzodiazepines: POSITIVE — AB
Cocaine: NOT DETECTED
Opiates: NOT DETECTED
Tetrahydrocannabinol: POSITIVE — AB

## 2021-03-15 LAB — RESP PANEL BY RT-PCR (FLU A&B, COVID) ARPGX2
Influenza A by PCR: NEGATIVE
Influenza B by PCR: NEGATIVE
SARS Coronavirus 2 by RT PCR: NEGATIVE

## 2021-03-15 LAB — SALICYLATE LEVEL: Salicylate Lvl: <7 mg/dL — ABNORMAL LOW (ref 7.0–30.0)

## 2021-03-15 LAB — APTT: aPTT: 28 seconds (ref 24–36)

## 2021-03-15 LAB — MAGNESIUM: Magnesium: 2.2 mg/dL (ref 1.7–2.4)

## 2021-03-15 LAB — C-REACTIVE PROTEIN: CRP: 0.6 mg/dL (ref <1.0)

## 2021-03-15 LAB — CK: Total CK: 325 U/L (ref 49–397)

## 2021-03-15 MED ORDER — CHLORHEXIDINE GLUCONATE 0.12% ORAL RINSE (MEDLINE KIT)
15.0000 mL | Freq: Two times a day (BID) | OROMUCOSAL | Status: DC
  Administered 2021-03-15 – 2021-03-16 (×2): 15 mL via OROMUCOSAL

## 2021-03-15 MED ORDER — LACTATED RINGERS IV SOLN: INTRAVENOUS | Status: AC

## 2021-03-15 MED ORDER — GADOBUTROL 1 MMOL/ML IV SOLN
8.0000 mL | Freq: Once | INTRAVENOUS | Status: AC | PRN
  Administered 2021-03-15: 20:00:00 8 mL via INTRAVENOUS

## 2021-03-15 MED ORDER — LORAZEPAM 2 MG/ML IJ SOLN: 4.0000 mg | INTRAMUSCULAR | Status: DC | PRN

## 2021-03-15 MED ORDER — DEXAMETHASONE SODIUM PHOSPHATE 10 MG/ML IJ SOLN
8.0000 mg | INTRAMUSCULAR | Status: DC
  Administered 2021-03-16 – 2021-03-18 (×3): 8 mg via INTRAVENOUS
  Filled 2021-03-15 (×3): qty 1

## 2021-03-15 MED ORDER — SODIUM CHLORIDE 0.9 % IV SOLN
2.0000 g | Freq: Two times a day (BID) | INTRAVENOUS | Status: DC
  Administered 2021-03-15 – 2021-03-17 (×5): 2 g via INTRAVENOUS
  Filled 2021-03-15 (×7): qty 20

## 2021-03-15 MED ORDER — VANCOMYCIN HCL 1250 MG/250ML IV SOLN
1250.0000 mg | Freq: Three times a day (TID) | INTRAVENOUS | Status: DC
  Administered 2021-03-16 – 2021-03-18 (×7): 1250 mg via INTRAVENOUS
  Filled 2021-03-15 (×8): qty 250

## 2021-03-15 MED ORDER — LACTATED RINGERS IV BOLUS (SEPSIS)
500.0000 mL | Freq: Once | INTRAVENOUS | Status: AC
  Administered 2021-03-15: 16:00:00 500 mL via INTRAVENOUS

## 2021-03-15 MED ORDER — ACETAMINOPHEN 650 MG RE SUPP: 650.0000 mg | Freq: Once | RECTAL | Status: DC

## 2021-03-15 MED ORDER — DEXTROSE 5 % IV SOLN
800.0000 mg | Freq: Three times a day (TID) | INTRAVENOUS | Status: DC
  Administered 2021-03-15 – 2021-03-17 (×5): 800 mg via INTRAVENOUS
  Filled 2021-03-15 (×8): qty 16

## 2021-03-15 MED ORDER — LACTATED RINGERS IV BOLUS (SEPSIS)
1000.0000 mL | Freq: Once | INTRAVENOUS | Status: AC
  Administered 2021-03-15: 14:00:00 1000 mL via INTRAVENOUS

## 2021-03-15 MED ORDER — VANCOMYCIN HCL 2000 MG/400ML IV SOLN
2000.0000 mg | Freq: Once | INTRAVENOUS | Status: AC
  Administered 2021-03-15: 17:00:00 2000 mg via INTRAVENOUS
  Filled 2021-03-15: qty 400

## 2021-03-15 MED ORDER — VANCOMYCIN HCL IN DEXTROSE 1-5 GM/200ML-% IV SOLN: 1000.0000 mg | Freq: Once | INTRAVENOUS | Status: DC

## 2021-03-15 MED ORDER — ORAL CARE MOUTH RINSE
15.0000 mL | OROMUCOSAL | Status: DC
  Administered 2021-03-16: 11:00:00 15 mL via OROMUCOSAL

## 2021-03-15 MED ORDER — DEXTROSE 5 % IV SOLN
800.0000 mg | Freq: Once | INTRAVENOUS | Status: AC
  Administered 2021-03-15: 14:00:00 800 mg via INTRAVENOUS
  Filled 2021-03-15: qty 16

## 2021-03-15 MED ORDER — CHLORHEXIDINE GLUCONATE CLOTH 2 % EX PADS
6.0000 | MEDICATED_PAD | Freq: Every day | CUTANEOUS | Status: DC
  Administered 2021-03-15 – 2021-03-20 (×5): 6 via TOPICAL

## 2021-03-15 MED ORDER — SODIUM CHLORIDE 0.9 % IV SOLN
2.0000 g | Freq: Once | INTRAVENOUS | Status: AC
  Administered 2021-03-15: 14:00:00 2 g via INTRAVENOUS
  Filled 2021-03-15: qty 20

## 2021-03-15 MED ORDER — SODIUM CHLORIDE 0.9 % IV SOLN: 75.0000 mL/h | INTRAVENOUS | Status: DC

## 2021-03-15 MED ORDER — DEXAMETHASONE SODIUM PHOSPHATE 10 MG/ML IJ SOLN
10.0000 mg | Freq: Once | INTRAMUSCULAR | Status: AC
  Administered 2021-03-15: 14:00:00 10 mg via INTRAVENOUS
  Filled 2021-03-15: qty 1

## 2021-03-15 MED ORDER — LORAZEPAM 2 MG/ML IJ SOLN
2.0000 mg | INTRAMUSCULAR | Status: AC | PRN
  Administered 2021-03-16 (×2): 2 mg via INTRAVENOUS
  Filled 2021-03-15 (×2): qty 1

## 2021-03-15 MED ORDER — SODIUM CHLORIDE 0.9 % IV SOLN
INTRAVENOUS | Status: DC | PRN
  Administered 2021-03-16: 21:00:00 200 mL via INTRAVENOUS

## 2021-03-15 MED ORDER — ACETAMINOPHEN 650 MG RE SUPP
650.0000 mg | RECTAL | Status: DC | PRN
  Administered 2021-03-17: 650 mg via RECTAL
  Filled 2021-03-15 (×3): qty 1

## 2021-03-15 MED ORDER — ENOXAPARIN SODIUM 40 MG/0.4ML IJ SOSY: 40.0000 mg | PREFILLED_SYRINGE | INTRAMUSCULAR | Status: DC
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
## 2021-03-15 NOTE — Progress Notes (Signed)
[facility]link following for sepsis protocol. 
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
## 2021-03-15 NOTE — Progress Notes (Signed)
A consult was received from an ED physician for rule out meningitis per pharmacy dosing.  The patient's profile has been reviewed for ht/wt/allergies/indication/available labs.   A one time order has been placed for vanc 2g and acyclovir 800mg .  Further antibiotics/pharmacy consults should be ordered by admitting physician if indicated.                       Thank you, 03/15/2021  1:17 PM

## 2021-03-15 NOTE — ED Triage Notes (Signed)
Pt BIB from home, family found pt on floor after suspected seizure, unwitnessed. Dx with seizure disorder but family is uncertain about med compliance. Recently had flu, had been feeling better. Minimal talking with EMS but following commands slowly. EMS notes odor suggestive of incontinence.  BP 142/68 HR 100 SpO2 95% CBG 149

## 2021-03-16 ENCOUNTER — Inpatient Hospital Stay (HOSPITAL_COMMUNITY)
Admit: 2021-03-16 | Discharge: 2021-03-16 | Disposition: A | Discharge status: Home / Self Care | Payer: Medicaid Other | Attending: Family Medicine | Admitting: Family Medicine

## 2021-03-16 DIAGNOSIS — R569 Unspecified convulsions: Secondary | ICD-10-CM

## 2021-03-16 DIAGNOSIS — G9341 Metabolic encephalopathy: Secondary | ICD-10-CM

## 2021-03-16 DIAGNOSIS — R4182 Altered mental status, unspecified: Secondary | ICD-10-CM

## 2021-03-16 LAB — COMPREHENSIVE METABOLIC PANEL
ALT: 40 U/L (ref 0–44)
AST: 28 U/L (ref 15–41)
Albumin: 4.2 g/dL (ref 3.5–5.0)
Alkaline Phosphatase: 61 U/L (ref 38–126)
Anion gap: 12 (ref 5–15)
BUN: 19 mg/dL (ref 6–20)
CO2: 21 mmol/L — ABNORMAL LOW (ref 22–32)
Calcium: 9.2 mg/dL (ref 8.9–10.3)
Chloride: 105 mmol/L (ref 98–111)
Creatinine, Ser: 0.84 mg/dL (ref 0.61–1.24)
GFR, Estimated: >60 mL/min (ref >60)
Glucose, Bld: 102 mg/dL — ABNORMAL HIGH (ref 70–99)
Potassium: 3.6 mmol/L (ref 3.5–5.1)
Sodium: 138 mmol/L (ref 135–145)
Total Bilirubin: 0.9 mg/dL (ref 0.3–1.2)
Total Protein: 7.6 g/dL (ref 6.5–8.1)

## 2021-03-16 LAB — CBC WITH DIFFERENTIAL/PLATELET
Abs Immature Granulocytes: 0.11 10*3/uL — ABNORMAL HIGH (ref 0.00–0.07)
Basophils Absolute: 0 10*3/uL (ref 0.0–0.1)
Basophils Relative: 0 %
Eosinophils Absolute: 0 10*3/uL (ref 0.0–0.5)
Eosinophils Relative: 0 %
HCT: 42.5 % (ref 39.0–52.0)
Hemoglobin: 14.3 g/dL (ref 13.0–17.0)
Immature Granulocytes: 1 %
Lymphocytes Relative: 9 %
Lymphs Abs: 1.6 10*3/uL (ref 0.7–4.0)
MCH: 29.5 pg (ref 26.0–34.0)
MCHC: 33.6 g/dL (ref 30.0–36.0)
MCV: 87.6 fL (ref 80.0–100.0)
Monocytes Absolute: 1.5 10*3/uL — ABNORMAL HIGH (ref 0.1–1.0)
Monocytes Relative: 9 %
Neutro Abs: 14.2 10*3/uL — ABNORMAL HIGH (ref 1.7–7.7)
Neutrophils Relative %: 81 %
Platelets: 245 10*3/uL (ref 150–400)
RBC: 4.85 MIL/uL (ref 4.22–5.81)
RDW: 12.8 % (ref 11.5–15.5)
WBC: 17.4 10*3/uL — ABNORMAL HIGH (ref 4.0–10.5)
nRBC: 0 % (ref 0.0–0.2)

## 2021-03-16 LAB — MRSA NEXT GEN BY PCR, NASAL: MRSA by PCR Next Gen: NOT DETECTED

## 2021-03-16 LAB — URINE CULTURE: Culture: NO GROWTH

## 2021-03-16 LAB — MAGNESIUM: Magnesium: 2.3 mg/dL (ref 1.7–2.4)

## 2021-03-16 MED ORDER — CHLORHEXIDINE GLUCONATE 0.12 % MT SOLN
15.0000 mL | Freq: Two times a day (BID) | OROMUCOSAL | Status: DC
  Administered 2021-03-16 – 2021-03-20 (×7): 15 mL via OROMUCOSAL
  Filled 2021-03-16 (×6): qty 15

## 2021-03-16 MED ORDER — ORAL CARE MOUTH RINSE
15.0000 mL | Freq: Two times a day (BID) | OROMUCOSAL | Status: DC
  Administered 2021-03-16 – 2021-03-18 (×6): 15 mL via OROMUCOSAL

## 2021-03-16 MED ORDER — CLONIDINE HCL 0.1 MG PO TABS: 0.1000 mg | ORAL_TABLET | Freq: Every day | ORAL | Status: DC

## 2021-03-16 MED ORDER — ONDANSETRON 4 MG PO TBDP: 4.0000 mg | ORAL_TABLET | Freq: Four times a day (QID) | ORAL | Status: DC | PRN

## 2021-03-16 MED ORDER — DICYCLOMINE HCL 20 MG PO TABS
20.0000 mg | ORAL_TABLET | Freq: Four times a day (QID) | ORAL | Status: DC | PRN
  Filled 2021-03-16: qty 1

## 2021-03-16 MED ORDER — LOPERAMIDE HCL 2 MG PO CAPS: 2.0000 mg | ORAL_CAPSULE | ORAL | Status: DC | PRN

## 2021-03-16 MED ORDER — NAPROXEN 250 MG PO TABS
500.0000 mg | ORAL_TABLET | Freq: Two times a day (BID) | ORAL | Status: DC | PRN
  Filled 2021-03-16: qty 2

## 2021-03-16 MED ORDER — CLONIDINE HCL 0.1 MG PO TABS
0.1000 mg | ORAL_TABLET | Freq: Four times a day (QID) | ORAL | Status: DC
  Administered 2021-03-16: 0.1 mg via ORAL
  Filled 2021-03-16: qty 1

## 2021-03-16 MED ORDER — CLONIDINE HCL 0.1 MG PO TABS: 0.1000 mg | ORAL_TABLET | ORAL | Status: DC

## 2021-03-16 MED ORDER — ACETAMINOPHEN 160 MG/5ML PO SOLN
650.0000 mg | Freq: Four times a day (QID) | ORAL | Status: DC | PRN
  Filled 2021-03-16: qty 20.3

## 2021-03-16 MED ORDER — LACTATED RINGERS IV SOLN
INTRAVENOUS | Status: AC
Start: 2021-03-16 — End: 2021-03-18

## 2021-03-16 MED ORDER — METOCLOPRAMIDE HCL 5 MG/ML IJ SOLN
5.0000 mg | Freq: Four times a day (QID) | INTRAMUSCULAR | Status: AC
  Administered 2021-03-16 – 2021-03-17 (×2): 5 mg via INTRAVENOUS
  Filled 2021-03-16 (×2): qty 2

## 2021-03-16 MED ORDER — METHOCARBAMOL 500 MG PO TABS: 500.0000 mg | ORAL_TABLET | Freq: Three times a day (TID) | ORAL | Status: DC | PRN

## 2021-03-16 MED ORDER — HYDROXYZINE HCL 25 MG PO TABS: 25.0000 mg | ORAL_TABLET | Freq: Four times a day (QID) | ORAL | Status: DC | PRN

## 2021-03-16 MED ORDER — ONDANSETRON HCL 4 MG/2ML IJ SOLN
4.0000 mg | Freq: Three times a day (TID) | INTRAMUSCULAR | Status: DC | PRN
  Administered 2021-03-16 – 2021-03-17 (×5): 4 mg via INTRAVENOUS
  Filled 2021-03-16 (×5): qty 2

## 2021-03-16 MED ORDER — KETOROLAC TROMETHAMINE 15 MG/ML IJ SOLN
15.0000 mg | Freq: Once | INTRAMUSCULAR | Status: AC | PRN
  Administered 2021-03-16: 21:00:00 15 mg via INTRAVENOUS
  Filled 2021-03-16: qty 1

## 2021-03-16 NOTE — Procedures (Signed)
Patient Name: Jeremy Short  MRN: [id number]  Epilepsy Attending: Charlsie Quest  Referring Physician/Provider: Anselm Jungling, DO Date: 03/16/2021 Duration: 27.16 mins  Patient history: 25yo m with seizure. EEG to evaluate for seizure  Level of alertness: Awake  AEDs during EEG study: None  Technical aspects: This EEG study was done with scalp electrodes positioned according to the 10-20 International system of electrode placement. Electrical activity was acquired at a sampling rate of 500Hz  and reviewed with a high frequency filter of 70Hz  and a low frequency filter of 1Hz . EEG data were recorded continuously and digitally stored.   Description: The posterior dominant rhythm consists of 7Hz  activity of moderate voltage (25-35 uV) seen predominantly in posterior head regions, symmetric and reactive to eye opening and eye closing. EEG showed continuous generalized 6 to 7 Hz theta slowing. Hyperventilation and photic stimulation were not performed.     Of note, eeg was technically difficult due to significant eye flutter artifact.  ABNORMALITY - Continuous slow, generalized - Background slow  IMPRESSION: This study is suggestive of mild to moderate diffuse encephalopathy, nonspecific etiology. No seizures or epileptiform discharges were seen throughout the recording.  Tnia Anglada 
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
## 2021-03-16 NOTE — Progress Notes (Signed)
EEG complete - results pending 

## 2021-03-16 NOTE — Consult Note (Signed)
[facility]  Total days of antibiotics 2/vanco,ceftriaxone, acyclovir       Reason for Consult:febrile illness with encephalopathy    Referring Physician: Janee Morn  Principal Problem:   Seizure (HCC) Active Problems:   Lactic acidosis   Leukocytosis   Polysubstance abuse (HCC)   Acute metabolic encephalopathy    HPI: Jeremy Short is a 25 y.o. male with hx of anxiety/PTSD, hx of benzo withdrawal seizure, polysubstance use, hx of lumbar herniated disc s/p microdiscectomy L4-L5-S1 on 12/5 by dr Conchita Paris, that is well healed. Lives at home with parents. Father reports patient was ill roughly 2 weeks ago with flu-like illness, other family members with similar symptoms. Patient started to improved, however did mention on the night prior to admit feeling poorly, but not localized to headache nor back pain nor cough. He was up to 2 am with his mother/father, but Subsequently found on the ground unresponsive the following morning. EMS mentioned patient slow to follow commands, loss of bladder, concern for seizure. No signs of tongue or cheek laceration. Labs remarkable for leukocytosis with left shift, 18k. Mild transamnitis- ALT of 56. UA, CXR non-revealing. FLu and covid PCR negative. Did undergo NCHCT no abn. And lumbar spine MRI did not show evidence of recent left hemilaminectomy but no signs concern for abscess at that site. He remains in ICU. Undergoing LTM EEG. Eyes open, tracking but not following direction. Still diaphoretic. UDS shows + benzos and marijuana. No other illicit drugs. Did not undergo LP yet.  Past Medical History:  Diagnosis Date   Anxiety 2018   Depression    Substance abuse (HCC)    hx or heroin use, still occasionally takes pain medication    Allergies: No Known Allergies MEDICATIONS:  acetaminophen  650 mg Rectal Once   chlorhexidine  15 mL Mouth Rinse BID   Chlorhexidine Gluconate Cloth  6 each Topical Q0600   dexamethasone  (DECADRON) injection  8 mg Intravenous Q24H   mouth rinse  15 mL Mouth Rinse q12n4p    Social History   Tobacco Use   Smoking status: Never   Smokeless tobacco: Never  Vaping Use   Vaping Use: Every day  Substance Use Topics   Alcohol use: Not Currently   Drug use: Yes    Types: Marijuana, Oxycodone    Comment: almost every day    Family History  Family history unknown: Yes    Review of Systems -  Unable to obtain due to encephalopathy  OBJECTIVE: Temp:  [98.4 F (36.9 C)-100.5 F (38.1 C)] 99.5 F (37.5 C) (12/26 1500) Pulse Rate:  [60-102] 79 (12/26 1500) Resp:  [22-39] 36 (12/26 1500) BP: (125-151)/(64-102) 151/83 (12/26 1400) SpO2:  [88 %-100 %] 88 % (12/26 1500) Weight:  [83.7 kg] 83.7 kg (12/26 1000) Physical Exam  Constitutional: He appears slightly in discomfort, diaphoretic. He appears well-developed and well-nourished. No distress.  HENT:  Mouth/Throat: Oropharynx is clear and moist. No oropharyngeal exudate.  Cardiovascular: Normal rate, regular rhythm and normal heart sounds. Exam reveals no gallop and no friction rub.  No murmur heard.  Pulmonary/Chest: Effort normal and breath sounds normal. No respiratory distress. He has no wheezes.  Abdominal: Soft. Bowel sounds are normal. He exhibits no distension. There is no tenderness.  Lymphadenopathy:  He has no cervical adenopathy.  Neurological: bilateral grasp, equal strength, not following commands. Down going toes.  Skin: Skin is warm and dry. No rash noted. No erythema. Echymosis to left wrist Psychiatric: He  has a normal mood and affect. His behavior is normal.    LABS: Results for orders placed or performed during the hospital encounter of 03/15/21 (from the past 48 hour(s))  Resp Panel by RT-PCR (Flu A&B, Covid) Nasopharyngeal Swab     Status: None   Collection Time: 03/15/21  2:08 PM   Specimen: Nasopharyngeal Swab; Nasopharyngeal(NP) swabs in vial transport medium  Result Value Ref Range    SARS Coronavirus 2 by RT PCR NEGATIVE NEGATIVE    Comment: (NOTE) SARS-CoV-2 target nucleic acids are NOT DETECTED.  The SARS-CoV-2 RNA is generally detectable in upper respiratory specimens during the acute phase of infection. The lowest concentration of SARS-CoV-2 viral copies this assay can detect is 138 copies/mL. A negative result does not preclude SARS-Cov-2 infection and should not be used as the sole basis for treatment or other patient management decisions. A negative result may occur with  improper specimen collection/handling, submission of specimen other than nasopharyngeal swab, presence of viral mutation(s) within the areas targeted by this assay, and inadequate number of viral copies(<138 copies/mL). A negative result must be combined with clinical observations, patient history, and epidemiological information. The expected result is Negative.  Fact Sheet for Patients:  EntrepreneurPulse.com.au  Fact Sheet for Healthcare Providers:  IncredibleEmployment.be  This test is no t yet approved or cleared by the [place] FDA and  has been authorized for detection and/or diagnosis of SARS-CoV-2 by FDA under an Emergency Use Authorization (EUA). This EUA will remain  in effect (meaning this test can be used) for the duration of the COVID-19 declaration under Section 564(b)(1) of the Act, 21 U.S.C.section 360bbb-3(b)(1), unless the authorization is terminated  or revoked sooner.       Influenza A by PCR NEGATIVE NEGATIVE   Influenza B by PCR NEGATIVE NEGATIVE    Comment: (NOTE) The Xpert Xpress SARS-CoV-2/FLU/RSV plus assay is intended as an aid in the diagnosis of influenza from Nasopharyngeal swab specimens and should not be used as a sole basis for treatment. Nasal washings and aspirates are unacceptable for Xpert Xpress SARS-CoV-2/FLU/RSV testing.  Fact Sheet for Patients: EntrepreneurPulse.com.au  Fact  Sheet for Healthcare Providers: IncredibleEmployment.be  This test is not yet approved or cleared by the [place] FDA and has been authorized for detection and/or diagnosis of SARS-CoV-2 by FDA under an Emergency Use Authorization (EUA). This EUA will remain in effect (meaning this test can be used) for the duration of the COVID-19 declaration under Section 564(b)(1) of the Act, 21 U.S.C. section 360bbb-3(b)(1), unless the authorization is terminated or revoked.  Performed at [facility], [place] [address], [place], [place] [address]   Lactic acid, plasma     Status: Abnormal   Collection Time: 03/15/21  2:08 PM  Result Value Ref Range   Lactic Acid, Venous 2.4 (HH) 0.5 - 1.9 mmol/L    Comment: CRITICAL RESULT CALLED TO, READ BACK BY AND VERIFIED WITH: I.Benjie Karvonen, RN AT [phone] ON 12.25.22 BY N.THOMPSON Performed at [place] [address], [place], [place] [address]   Comprehensive metabolic panel     Status: Abnormal   Collection Time: 03/15/21  2:08 PM  Result Value Ref Range   Sodium 136 135 - 145 mmol/L   Potassium 3.9 3.5 - 5.1 mmol/L   Chloride 100 98 - 111 mmol/L   CO2 23 22 - 32 mmol/L   Glucose, Bld 116 (H) 70 - 99 mg/dL    Comment: Glucose reference range applies only to samples  taken after fasting for at least 8 hours.   BUN 20 6 - 20 mg/dL   Creatinine, Ser 4.85 0.61 - 1.24 mg/dL   Calcium 9.9 8.9 - 46.2 mg/dL   Total Protein 8.7 (H) 6.5 - 8.1 g/dL   Albumin 4.7 3.5 - 5.0 g/dL   AST 37 15 - 41 U/L   ALT 56 (H) 0 - 44 U/L   Alkaline Phosphatase 76 38 - 126 U/L   Total Bilirubin 1.1 0.3 - 1.2 mg/dL   GFR, Estimated >70 >35 mL/min    Comment: (NOTE) Calculated using the CKD-EPI Creatinine Equation (2021)    Anion gap 13 5 - 15    Comment: Performed at [facility], [address], [place], [place] [address]  CBC WITH DIFFERENTIAL     Status: Abnormal   Collection Time: 03/15/21  2:08  PM  Result Value Ref Range   WBC 18.0 (H) 4.0 - 10.5 K/uL   RBC 5.32 4.22 - 5.81 MIL/uL   Hemoglobin 15.6 13.0 - 17.0 g/dL   HCT 18.2 99.3 - 71.6 %   MCV 86.8 80.0 - 100.0 fL   MCH 29.3 26.0 - 34.0 pg   MCHC 33.8 30.0 - 36.0 g/dL   RDW 96.7 89.3 - 81.0 %   Platelets 310 150 - 400 K/uL   nRBC 0.0 0.0 - 0.2 %   Neutrophils Relative % 89 %   Neutro Abs 16.0 (H) 1.7 - 7.7 K/uL   Lymphocytes Relative 5 %   Lymphs Abs 0.8 0.7 - 4.0 K/uL   Monocytes Relative 6 %   Monocytes Absolute 1.0 0.1 - 1.0 K/uL   Eosinophils Relative 0 %   Eosinophils Absolute 0.0 0.0 - 0.5 K/uL   Basophils Relative 0 %   Basophils Absolute 0.0 0.0 - 0.1 K/uL   Immature Granulocytes 0 %   Abs Immature Granulocytes 0.08 (H) 0.00 - 0.07 K/uL    Comment: Performed at [facility], [address], [place], [place] [address]  Protime-INR     Status: None   Collection Time: 03/15/21  2:08 PM  Result Value Ref Range   Prothrombin Time 13.8 11.4 - 15.2 seconds   INR 1.1 0.8 - 1.2    Comment: (NOTE) INR goal varies based on device and disease states. Performed at [facility], [address], [place], [place] [address]   APTT     Status: None   Collection Time: 03/15/21  2:08 PM  Result Value Ref Range   aPTT 28 24 - 36 seconds    Comment: Performed at [facility], [address] [place], [place] [address]  Blood Culture (routine x 2)     Status: None (Preliminary result)   Collection Time: 03/15/21  2:08 PM   Specimen: BLOOD  Result Value Ref Range   Specimen Description      BLOOD LEFT ARM Performed at [facility], [address] [place], [place] [address]    Special Requests      BOTTLES DRAWN AEROBIC AND ANAEROBIC Blood Culture adequate volume Performed at [facility], [address] [place], [place] [address]    Culture      NO GROWTH < 12 HOURS Performed at [facility], [address]  [place], [place] [address]    Report Status PENDING   Urinalysis, Routine w reflex microscopic Urine, Clean Catch     Status: Abnormal   Collection Time:  03/15/21  2:08 PM  Result Value Ref Range   Color, Urine YELLOW YELLOW   APPearance CLEAR CLEAR   Specific Gravity, Urine 1.030 1.005 - 1.030   pH 6.0 5.0 - 8.0   Glucose, UA NEGATIVE NEGATIVE mg/dL   Hgb urine dipstick NEGATIVE NEGATIVE   Bilirubin Urine NEGATIVE NEGATIVE   Ketones, ur 20 (A) NEGATIVE mg/dL   Protein, ur NEGATIVE NEGATIVE mg/dL   Nitrite NEGATIVE NEGATIVE   Leukocytes,Ua NEGATIVE NEGATIVE   RBC / HPF 0-5 0 - 5 RBC/hpf   WBC, UA 0-5 0 - 5 WBC/hpf   Bacteria, UA NONE SEEN NONE SEEN   Mucus PRESENT     Comment: Performed at [facility], [place] [address] [place], [place] [address]  Rapid urine drug screen (hospital performed)     Status: Abnormal   Collection Time: 03/15/21  2:08 PM  Result Value Ref Range   Opiates NONE DETECTED NONE DETECTED   Cocaine NONE DETECTED NONE DETECTED   Benzodiazepines POSITIVE (A) NONE DETECTED   Amphetamines NONE DETECTED NONE DETECTED   Tetrahydrocannabinol POSITIVE (A) NONE DETECTED   Barbiturates NONE DETECTED NONE DETECTED    Comment: (NOTE) DRUG SCREEN FOR MEDICAL PURPOSES ONLY.  IF CONFIRMATION IS NEEDED FOR ANY PURPOSE, NOTIFY LAB WITHIN 5 DAYS.  LOWEST DETECTABLE LIMITS FOR URINE DRUG SCREEN Drug Class                     Cutoff (ng/mL) Amphetamine and metabolites    1000 Barbiturate and metabolites    200 Benzodiazepine                 [id number] Tricyclics and metabolites     300 Opiates and metabolites        300 Cocaine and metabolites        300 THC                            50 Performed at [facility], [place] [address] [place], [place] [address]   Magnesium     Status: None   Collection Time: 03/15/21  2:08 PM  Result Value Ref Range   Magnesium 2.2 1.7 - 2.4 mg/dL    Comment: Performed at [facility], [place] [address] [place], [place] [address]  Sedimentation rate     Status: None   Collection Time: 03/15/21  2:08 PM  Result Value Ref Range   Sed Rate 7 0 - 16 mm/hr    Comment: Performed at [facility], [place] [address], [place], [place] [address]  C-reactive protein     Status: None   Collection Time: 03/15/21  2:08 PM  Result Value Ref Range   CRP 0.6 <1.0 mg/dL    Comment: Performed at [facility], [place] [address] [place], [place] [address]  Lactic acid, plasma     Status: None   Collection Time: 03/15/21  3:10 PM  Result Value Ref Range   Lactic Acid, Venous 1.9 0.5 - 1.9 mmol/L    Comment: Performed at [facility], [place] [address], [place], [place] [address]  MRSA Next Gen by PCR, Nasal     Status: None   Collection Time: 03/15/21  8:56 PM   Specimen: Nasal Mucosa; Nasal Swab  Result Value Ref Range   MRSA by PCR Next Gen NOT DETECTED NOT DETECTED    Comment: (NOTE) The GeneXpert  MRSA Assay (FDA approved for NASAL specimens only), is one component of a comprehensive MRSA colonization surveillance program. It is not intended to diagnose MRSA infection nor to guide or monitor treatment for MRSA infections. Test performance is not FDA approved in patients less than 10 years old. Performed at [facility], [place] [address] [place], [place] [address]   Acetaminophen level     Status: Abnormal   Collection Time: 03/15/21  9:38 PM  Result Value Ref Range   Acetaminophen (Tylenol), Serum <10 (L) 10 - 30 ug/mL    Comment: (NOTE) Therapeutic concentrations vary significantly. A range of 10-30 ug/mL  may be an effective concentration for many patients. However, some  are best treated at concentrations outside of this range. Acetaminophen concentrations >150 ug/mL at 4 hours after ingestion  and >50 ug/mL at 12 hours after ingestion are often associated with  toxic reactions.  Performed at [facility],  [place] [address] [place], [place] [id number]   Salicylate level     Status: Abnormal   Collection Time: 03/15/21  9:38 PM  Result Value Ref Range   Salicylate Lvl Q000111Q (L) 7.0 - 30.0 mg/dL    Comment: Performed at [facility], [place] [address] [place], [place] [address]  CK     Status: None   Collection Time: 03/15/21  9:38 PM  Result Value Ref Range   Total CK 325 49 - 397 U/L    Comment: Performed at [facility], [place] [address], [place],  [address]  Comprehensive metabolic panel     Status: Abnormal   Collection Time: 03/16/21  8:31 AM  Result Value Ref Range   Sodium 138 135 - 145 mmol/L   Potassium 3.6 3.5 - 5.1 mmol/L   Chloride 105 98 - 111 mmol/L   CO2 21 (L) 22 - 32 mmol/L   Glucose, Bld 102 (H) 70 - 99 mg/dL    Comment: Glucose reference range applies only to samples taken after fasting for at least 8 hours.   BUN 19 6 - 20 mg/dL   Creatinine, Ser 0.84 0.61 - 1.24 mg/dL   Calcium 9.2 8.9 - 10.3 mg/dL   Total Protein 7.6 6.5 - 8.1 g/dL   Albumin 4.2 3.5 - 5.0 g/dL   AST 28 15 - 41 U/L   ALT 40 0 - 44 U/L   Alkaline Phosphatase 61 38 - 126 U/L   Total Bilirubin 0.9 0.3 - 1.2 mg/dL   GFR, Estimated >60 >60 mL/min    Comment: (NOTE) Calculated using the CKD-EPI Creatinine Equation (2021)    Anion gap 12 5 - 15    Comment: Performed at [facility], [place] [address], [place],  [address]  CBC with Differential/Platelet     Status: Abnormal   Collection Time: 03/16/21  8:31 AM  Result Value Ref Range   WBC 17.4 (H) 4.0 - 10.5 K/uL   RBC 4.85 4.22 - 5.81 MIL/uL   Hemoglobin 14.3 13.0 - 17.0 g/dL   HCT 42.5 39.0 - 52.0 %   MCV 87.6 80.0 - 100.0 fL   MCH 29.5 26.0 - 34.0 pg   MCHC 33.6 30.0 - 36.0 g/dL   RDW 12.8 11.5 - 15.5 %   Platelets 245 150 - 400 K/uL   nRBC 0.0 0.0 - 0.2 %   Neutrophils Relative % 81 %   Neutro Abs 14.2 (H) 1.7 - 7.7 K/uL   Lymphocytes Relative 9 %  Lymphs Abs 1.6 0.7 -  4.0 K/uL   Monocytes Relative 9 %   Monocytes Absolute 1.5 (H) 0.1 - 1.0 K/uL   Eosinophils Relative 0 %   Eosinophils Absolute 0.0 0.0 - 0.5 K/uL   Basophils Relative 0 %   Basophils Absolute 0.0 0.0 - 0.1 K/uL   Immature Granulocytes 1 %   Abs Immature Granulocytes 0.11 (H) 0.00 - 0.07 K/uL    Comment: Performed at [facility], [place] [address], [place], [place] [address]  Magnesium     Status: None   Collection Time: 03/16/21  8:31 AM  Result Value Ref Range   Magnesium 2.3 1.7 - 2.4 mg/dL    Comment: Performed at [facility], [place] [address] [place], [place] [address]    MICRO: Blood cx pending IMAGING: CT Head Wo Contrast  Result Date: 03/15/2021 CLINICAL DATA:  Mental status changes. EXAM: CT HEAD WITHOUT CONTRAST TECHNIQUE: Contiguous axial images were obtained from the base of the skull through the vertex without intravenous contrast. COMPARISON:  12/05/2020 FINDINGS: Brain: There is no evidence for acute hemorrhage, hydrocephalus, mass lesion, or abnormal extra-axial fluid collection. No definite CT evidence for acute infarction. Vascular: No hyperdense vessel or unexpected calcification. Skull: No evidence for fracture. No worrisome lytic or sclerotic lesion. Sinuses/Orbits: The visualized paranasal sinuses and mastoid air cells are clear. Visualized portions of the globes and intraorbital fat are unremarkable. Other: None. IMPRESSION: No acute intracranial abnormality. Electronically Signed   By: Misty Stanley M.D.   On: 03/15/2021 15:12   MR Lumbar Spine W Wo Contrast  Result Date: 03/15/2021 CLINICAL DATA:  Low back pain. Prior surgery. Symptoms worsening after seizure. EXAM: MRI LUMBAR SPINE WITHOUT AND WITH CONTRAST TECHNIQUE: Multiplanar and multiecho pulse sequences of the lumbar spine were obtained without and with intravenous contrast. CONTRAST:  52mL GADAVIST GADOBUTROL 1 MMOL/ML IV SOLN COMPARISON:  Radiography 02/23/2021 FINDINGS:  The study is degraded by motion. Segmentation: 5 lumbar type vertebral bodies. Alignment:  No malalignment. Vertebrae:  No fracture or focal bone lesion. Conus medullaris and cauda equina: Conus extends to the L1 level. Conus and cauda equina appear normal. Paraspinal and other soft tissues: Negative Disc levels: No abnormality at L2-3 or above. L3-4: Mild bulging of the disc.  No compressive stenosis. L4-5: Previous left hemilaminectomy. Shallow protrusion of the disc. Mild stenosis of both lateral recesses. L5-S1: Previous left hemilaminectomy. Recurrent left posterolateral disc herniation. Left S1 nerve compression is possible. IMPRESSION: Marked motion degradation. Previous left hemilaminectomy at L4-5 and L5-S1. Probable recurrent left posterolateral disc herniation at L5-S1, likely to compress the left S1 nerve. Shallow disc protrusion of the L4-5 disc with mild stenosis of both lateral recesses. Electronically Signed   By: Nelson Chimes M.D.   On: 03/15/2021 19:52   DG Chest Port 1 View  Result Date: 03/15/2021 CLINICAL DATA:  Question ule sepsis.  Altered mental status. EXAM: PORTABLE CHEST 1 VIEW COMPARISON:  11/03/2019 FINDINGS: 1436 hours. Leftward patient rotation. The lungs are clear without focal pneumonia, edema, pneumothorax or pleural effusion. The cardiopericardial silhouette is within normal limits for size. The visualized bony structures of the thorax show no acute abnormality. Telemetry leads overlie the chest. IMPRESSION: Rotated film without acute cardiopulmonary findings. Electronically Signed   By: Misty Stanley M.D.   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 Currently  on vanco/ceftriaxone/azithromycin, with one day of prodrome of malaise.  - recommend to get LP with cell count, protein, glucose aerobic cx, HSV  PCR - it does not appear to be related to recent back surgery - will continue to monitor blood cx - unclear if his seizure like presentation is solely due to infectious causes. CSF analysis will be useful. - will provide further recs as more information returns.

## 2021-03-16 NOTE — Progress Notes (Addendum)
° ° °  OVERNIGHT PROGRESS REPORT  Notified by RN for concern over possible withdrawal due to notation and admission notes, patient behavior. Ordered Clonidine of the Clonidine withdrawal protocol due to notation of intent for this protocol by Dr Janee Morn on 03/16/2021 at 10:09 AM     Chinita [place] MSNA MSN ACNPC-AG Acute Care Nurse Practitioner [facility]

## 2021-03-17 ENCOUNTER — Inpatient Hospital Stay (HOSPITAL_COMMUNITY): Payer: Medicaid Other

## 2021-03-17 LAB — CSF CELL COUNT WITH DIFFERENTIAL
RBC Count, CSF: 1 /mm3 — ABNORMAL HIGH
Tube #: 4
WBC, CSF: 2 /mm3 (ref 0–5)

## 2021-03-17 LAB — CBC WITH DIFFERENTIAL/PLATELET
Abs Immature Granulocytes: 0.08 10*3/uL — ABNORMAL HIGH (ref 0.00–0.07)
Basophils Absolute: 0 10*3/uL (ref 0.0–0.1)
Basophils Relative: 0 %
Eosinophils Absolute: 0 10*3/uL (ref 0.0–0.5)
Eosinophils Relative: 0 %
HCT: 37.3 % — ABNORMAL LOW (ref 39.0–52.0)
Hemoglobin: 12.8 g/dL — ABNORMAL LOW (ref 13.0–17.0)
Immature Granulocytes: 0 %
Lymphocytes Relative: 12 %
Lymphs Abs: 2.2 10*3/uL (ref 0.7–4.0)
MCH: 30 pg (ref 26.0–34.0)
MCHC: 34.3 g/dL (ref 30.0–36.0)
MCV: 87.4 fL (ref 80.0–100.0)
Monocytes Absolute: 1.5 10*3/uL — ABNORMAL HIGH (ref 0.1–1.0)
Monocytes Relative: 8 %
Neutro Abs: 14.2 10*3/uL — ABNORMAL HIGH (ref 1.7–7.7)
Neutrophils Relative %: 80 %
Platelets: 237 10*3/uL (ref 150–400)
RBC: 4.27 MIL/uL (ref 4.22–5.81)
RDW: 13 % (ref 11.5–15.5)
WBC: 17.9 10*3/uL — ABNORMAL HIGH (ref 4.0–10.5)
nRBC: 0 % (ref 0.0–0.2)

## 2021-03-17 LAB — BASIC METABOLIC PANEL
Anion gap: 9 (ref 5–15)
BUN: 19 mg/dL (ref 6–20)
CO2: 22 mmol/L (ref 22–32)
Calcium: 8.5 mg/dL — ABNORMAL LOW (ref 8.9–10.3)
Chloride: 107 mmol/L (ref 98–111)
Creatinine, Ser: 0.95 mg/dL (ref 0.61–1.24)
GFR, Estimated: >60 mL/min (ref >60)
Glucose, Bld: 99 mg/dL (ref 70–99)
Potassium: 3.7 mmol/L (ref 3.5–5.1)
Sodium: 138 mmol/L (ref 135–145)

## 2021-03-17 LAB — PROTEIN, CSF: Total  Protein, CSF: 28 mg/dL (ref 15–45)

## 2021-03-17 LAB — MAGNESIUM: Magnesium: 2.1 mg/dL (ref 1.7–2.4)

## 2021-03-17 LAB — GLUCOSE, CSF: Glucose, CSF: 66 mg/dL (ref 40–70)

## 2021-03-17 LAB — VANCOMYCIN, TROUGH: Vancomycin Tr: 15 ug/mL (ref 15–20)

## 2021-03-17 MED ORDER — LORAZEPAM 2 MG/ML IJ SOLN: 0.0000 mg | Freq: Two times a day (BID) | INTRAMUSCULAR | Status: DC

## 2021-03-17 MED ORDER — LORAZEPAM 2 MG/ML IJ SOLN
1.0000 mg | Freq: Once | INTRAMUSCULAR | Status: AC
  Administered 2021-03-17: 01:00:00 1 mg via INTRAVENOUS
  Filled 2021-03-17: qty 1

## 2021-03-17 MED ORDER — LORAZEPAM 1 MG PO TABS
1.0000 mg | ORAL_TABLET | ORAL | Status: AC | PRN
  Administered 2021-03-18: 20:00:00 2 mg via ORAL
  Administered 2021-03-19 (×5): 1 mg via ORAL
  Administered 2021-03-19: 03:00:00 2 mg via ORAL
  Administered 2021-03-20: 03:00:00 1 mg via ORAL
  Filled 2021-03-17 (×3): qty 1
  Filled 2021-03-17: qty 2
  Filled 2021-03-17 (×3): qty 1
  Filled 2021-03-17: qty 2

## 2021-03-17 MED ORDER — ZIPRASIDONE MESYLATE 20 MG IM SOLR
20.0000 mg | Freq: Once | INTRAMUSCULAR | Status: AC
  Administered 2021-03-17: 04:00:00 20 mg via INTRAMUSCULAR
  Filled 2021-03-17: qty 20

## 2021-03-17 MED ORDER — ACYCLOVIR SODIUM 50 MG/ML IV SOLN
10.0000 mg/kg | Freq: Three times a day (TID) | INTRAVENOUS | Status: DC
Start: 2021-03-17 — End: 2021-03-19
  Administered 2021-03-17 – 2021-03-19 (×7): 835 mg via INTRAVENOUS
  Filled 2021-03-17 (×9): qty 16.7

## 2021-03-17 MED ORDER — ACETAMINOPHEN 325 MG PO TABS
650.0000 mg | ORAL_TABLET | Freq: Once | ORAL | Status: DC
  Filled 2021-03-17: qty 2

## 2021-03-17 MED ORDER — LORAZEPAM 2 MG/ML IJ SOLN
1.0000 mg | INTRAMUSCULAR | Status: AC | PRN
  Administered 2021-03-17: 13:00:00 2 mg via INTRAVENOUS
  Filled 2021-03-17: qty 1

## 2021-03-17 MED ORDER — LORAZEPAM 2 MG/ML IJ SOLN
1.0000 mg | INTRAMUSCULAR | Status: DC | PRN
Start: 2021-03-17 — End: 2021-03-20

## 2021-03-17 MED ORDER — PROCHLORPERAZINE EDISYLATE 10 MG/2ML IJ SOLN
5.0000 mg | Freq: Once | INTRAMUSCULAR | Status: AC
  Administered 2021-03-17: 04:00:00 5 mg via INTRAVENOUS
  Filled 2021-03-17: qty 2

## 2021-03-17 MED ORDER — SODIUM CHLORIDE 0.9% FLUSH
10.0000 mL | Freq: Two times a day (BID) | INTRAVENOUS | Status: DC
  Administered 2021-03-17 – 2021-03-19 (×3): 10 mL

## 2021-03-17 MED ORDER — THIAMINE HCL 100 MG PO TABS
100.0000 mg | ORAL_TABLET | Freq: Every day | ORAL | Status: DC
  Administered 2021-03-19 – 2021-03-20 (×2): 100 mg via ORAL
  Filled 2021-03-17 (×2): qty 1

## 2021-03-17 MED ORDER — ADULT MULTIVITAMIN W/MINERALS CH
1.0000 | ORAL_TABLET | Freq: Every day | ORAL | Status: DC
  Administered 2021-03-18 – 2021-03-20 (×3): 1 via ORAL
  Filled 2021-03-17 (×3): qty 1

## 2021-03-17 MED ORDER — SODIUM CHLORIDE 0.9% FLUSH: 10.0000 mL | INTRAVENOUS | Status: DC | PRN

## 2021-03-17 MED ORDER — THIAMINE HCL 100 MG/ML IJ SOLN
100.0000 mg | Freq: Every day | INTRAMUSCULAR | Status: DC
  Administered 2021-03-17 – 2021-03-18 (×2): 100 mg via INTRAVENOUS
  Filled 2021-03-17 (×2): qty 2

## 2021-03-17 MED ORDER — LIDOCAINE HCL 1 % IJ SOLN
5.0000 mL | Freq: Once | INTRAMUSCULAR | Status: AC
Start: 2021-03-17 — End: 2021-03-17
  Administered 2021-03-17: 13:00:00 5 mL via INTRADERMAL

## 2021-03-17 MED ORDER — FOLIC ACID 1 MG PO TABS
1.0000 mg | ORAL_TABLET | Freq: Every day | ORAL | Status: DC
  Administered 2021-03-18 – 2021-03-20 (×3): 1 mg via ORAL
  Filled 2021-03-17 (×3): qty 1

## 2021-03-17 MED ORDER — LORAZEPAM 2 MG/ML IJ SOLN
0.0000 mg | Freq: Four times a day (QID) | INTRAMUSCULAR | Status: DC
  Administered 2021-03-17 (×2): 1 mg via INTRAVENOUS
  Administered 2021-03-17 – 2021-03-18 (×2): 2 mg via INTRAVENOUS
  Filled 2021-03-17 (×4): qty 1

## 2021-03-17 NOTE — Procedures (Signed)
Fluoroscopic guided lumbar puncture performed successfully with nearly 10 mL of clear CSF sent to lab for analysis. Trace blood loss. Patient tolerated procedure well. See dictation in PACS for procedure details.
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
## 2021-03-17 NOTE — Progress Notes (Signed)
Per sister Amira, pt takes suboxone.  NP notified, and unable to locate RX.  Upon questioning pt, he stated he gets suboxone on the street.  NP notified.  Pt extremely restless, pulling at lines and picking at soiled linens (emesis and feces - pt incontinent of feces x2).  Pt intermittently making sense, but at times, confused. X2 minute seizure noted at 2130 - ativan given x2.    Geodon order obtained and given.  Awaiting effect

## 2021-03-17 NOTE — TOC Progression Note (Signed)
Transition of Care [facility]) - Progression Note    Patient Details  Name: Jeremy Short MRN: [id number] Date of Birth: 05-21-1995  Transition of Care [facility]) CM/SW Contact  Geni Bers, RN Phone Number: 03/17/2021, 3:32 PM  Clinical Narrative:     TOC will continueto follow for discharge, need.   Expected Discharge Plan: Home/Self Care Barriers to Discharge: No Barriers Identified  Expected Discharge Plan and Services Expected Discharge Plan: Home/Self Care       Living arrangements for the past 2 months: Single Family Home                                       Social Determinants of Health (SDOH) Interventions    Readmission Risk Interventions No flowsheet data found.

## 2021-03-18 LAB — CBC WITH DIFFERENTIAL/PLATELET
Abs Immature Granulocytes: 0.05 10*3/uL (ref 0.00–0.07)
Basophils Absolute: 0 10*3/uL (ref 0.0–0.1)
Basophils Relative: 0 %
Eosinophils Absolute: 0 10*3/uL (ref 0.0–0.5)
Eosinophils Relative: 0 %
HCT: 39.5 % (ref 39.0–52.0)
Hemoglobin: 13.4 g/dL (ref 13.0–17.0)
Immature Granulocytes: 0 %
Lymphocytes Relative: 22 %
Lymphs Abs: 2.6 10*3/uL (ref 0.7–4.0)
MCH: 29.8 pg (ref 26.0–34.0)
MCHC: 33.9 g/dL (ref 30.0–36.0)
MCV: 88 fL (ref 80.0–100.0)
Monocytes Absolute: 1.3 10*3/uL — ABNORMAL HIGH (ref 0.1–1.0)
Monocytes Relative: 11 %
Neutro Abs: 7.9 10*3/uL — ABNORMAL HIGH (ref 1.7–7.7)
Neutrophils Relative %: 67 %
Platelets: 228 10*3/uL (ref 150–400)
RBC: 4.49 MIL/uL (ref 4.22–5.81)
RDW: 12.5 % (ref 11.5–15.5)
WBC: 11.8 10*3/uL — ABNORMAL HIGH (ref 4.0–10.5)
nRBC: 0 % (ref 0.0–0.2)

## 2021-03-18 LAB — BASIC METABOLIC PANEL
Anion gap: 13 (ref 5–15)
BUN: 16 mg/dL (ref 6–20)
CO2: 21 mmol/L — ABNORMAL LOW (ref 22–32)
Calcium: 8.8 mg/dL — ABNORMAL LOW (ref 8.9–10.3)
Chloride: 105 mmol/L (ref 98–111)
Creatinine, Ser: 0.74 mg/dL (ref 0.61–1.24)
GFR, Estimated: >60 mL/min (ref >60)
Glucose, Bld: 79 mg/dL (ref 70–99)
Potassium: 4 mmol/L (ref 3.5–5.1)
Sodium: 139 mmol/L (ref 135–145)

## 2021-03-18 LAB — MAGNESIUM: Magnesium: 2.4 mg/dL (ref 1.7–2.4)

## 2021-03-18 MED ORDER — VALPROIC ACID 250 MG PO CAPS
500.0000 mg | ORAL_CAPSULE | Freq: Three times a day (TID) | ORAL | Status: DC
  Administered 2021-03-18 – 2021-03-20 (×5): 500 mg via ORAL
  Filled 2021-03-18 (×7): qty 2

## 2021-03-18 MED ORDER — ACETAMINOPHEN 650 MG RE SUPP: 650.0000 mg | Freq: Four times a day (QID) | RECTAL | Status: DC | PRN

## 2021-03-18 MED ORDER — ACETAMINOPHEN 325 MG PO TABS
650.0000 mg | ORAL_TABLET | Freq: Four times a day (QID) | ORAL | Status: DC | PRN
  Administered 2021-03-19: 11:00:00 650 mg via ORAL
  Filled 2021-03-18: qty 2

## 2021-03-18 MED ORDER — ONDANSETRON HCL 4 MG PO TABS: 4.0000 mg | ORAL_TABLET | Freq: Four times a day (QID) | ORAL | Status: DC | PRN

## 2021-03-18 MED ORDER — ONDANSETRON HCL 4 MG/2ML IJ SOLN: 4.0000 mg | Freq: Four times a day (QID) | INTRAMUSCULAR | Status: DC | PRN

## 2021-03-18 NOTE — Progress Notes (Signed)
Brief ID note:  Patient underwent lumbar puncture yesterday which was not consistent with meningoencephalitis and showed 2 WBC, protein normal, glucose normal.  CSF cultures are pending and Gram stain was negative.  Additionally HSV 1/2 PCR from the CSF is also pending.  Patient continues on acyclovir, ceftriaxone, and vancomycin.  Neurology consult is pending.  Patient continues to be persistently febrile with a T-max over the last 24 hours of 101.3 F.  Will stop ceftriaxone and vancomycin at this time given infectious work-up has been negative and CSF is not indicative of bacterial meningitis.  Unlikely to be HSV encephalitis as well given bland CSF, however, will plan to continue acyclovir pending the HSV PCR results. If HSV is negative, then would stop acyclovir as well.    [facility] 03/18/2021, 9:12 AM
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
## 2021-03-18 NOTE — Progress Notes (Signed)
Patient has large infiltration in left arm.  USG IV placed in right forearm for acyclovir infusion.  Patient has very few options for further IV access.  Please consider central line/PICC if acyclovir is continuing for a prolonged period of time.

## 2021-03-19 ENCOUNTER — Inpatient Hospital Stay (HOSPITAL_COMMUNITY): Payer: Medicaid Other

## 2021-03-19 ENCOUNTER — Other Ambulatory Visit: Payer: Self-pay

## 2021-03-19 ENCOUNTER — Encounter (HOSPITAL_COMMUNITY): Payer: Self-pay | Admitting: Family Medicine

## 2021-03-19 DIAGNOSIS — I82622 Acute embolism and thrombosis of deep veins of left upper extremity: Secondary | ICD-10-CM | POA: Diagnosis present

## 2021-03-19 DIAGNOSIS — R6520 Severe sepsis without septic shock: Secondary | ICD-10-CM | POA: Diagnosis present

## 2021-03-19 DIAGNOSIS — R291 Meningismus: Secondary | ICD-10-CM | POA: Diagnosis present

## 2021-03-19 DIAGNOSIS — M5127 Other intervertebral disc displacement, lumbosacral region: Secondary | ICD-10-CM | POA: Diagnosis present

## 2021-03-19 DIAGNOSIS — A419 Sepsis, unspecified organism: Secondary | ICD-10-CM | POA: Diagnosis present

## 2021-03-19 DIAGNOSIS — Z9889 Other specified postprocedural states: Secondary | ICD-10-CM

## 2021-03-19 DIAGNOSIS — R609 Edema, unspecified: Secondary | ICD-10-CM

## 2021-03-19 LAB — COMPREHENSIVE METABOLIC PANEL
ALT: 38 U/L (ref 0–44)
AST: 26 U/L (ref 15–41)
Albumin: 3.8 g/dL (ref 3.5–5.0)
Alkaline Phosphatase: 52 U/L (ref 38–126)
Anion gap: 12 (ref 5–15)
BUN: 15 mg/dL (ref 6–20)
CO2: 22 mmol/L (ref 22–32)
Calcium: 8.8 mg/dL — ABNORMAL LOW (ref 8.9–10.3)
Chloride: 103 mmol/L (ref 98–111)
Creatinine, Ser: 0.76 mg/dL (ref 0.61–1.24)
GFR, Estimated: >60 mL/min (ref >60)
Glucose, Bld: 97 mg/dL (ref 70–99)
Potassium: 3.5 mmol/L (ref 3.5–5.1)
Sodium: 137 mmol/L (ref 135–145)
Total Bilirubin: 0.9 mg/dL (ref 0.3–1.2)
Total Protein: 6.4 g/dL — ABNORMAL LOW (ref 6.5–8.1)

## 2021-03-19 LAB — CBC
HCT: 37.9 % — ABNORMAL LOW (ref 39.0–52.0)
Hemoglobin: 13.2 g/dL (ref 13.0–17.0)
MCH: 29.6 pg (ref 26.0–34.0)
MCHC: 34.8 g/dL (ref 30.0–36.0)
MCV: 85 fL (ref 80.0–100.0)
Platelets: 247 10*3/uL (ref 150–400)
RBC: 4.46 MIL/uL (ref 4.22–5.81)
RDW: 11.9 % (ref 11.5–15.5)
WBC: 11.8 10*3/uL — ABNORMAL HIGH (ref 4.0–10.5)
nRBC: 0 % (ref 0.0–0.2)

## 2021-03-19 LAB — AMMONIA: Ammonia: 22 umol/L (ref 9–35)

## 2021-03-19 LAB — HSV 1/2 PCR, CSF
HSV-1 DNA: NEGATIVE
HSV-2 DNA: NEGATIVE

## 2021-03-19 LAB — MAGNESIUM: Magnesium: 2.1 mg/dL (ref 1.7–2.4)

## 2021-03-19 MED ORDER — APIXABAN 5 MG PO TABS: 10.0000 mg | ORAL_TABLET | Freq: Two times a day (BID) | ORAL | Status: DC

## 2021-03-19 MED ORDER — SODIUM CHLORIDE 0.9 % IV SOLN: INTRAVENOUS | Status: DC

## 2021-03-19 MED ORDER — APIXABAN 5 MG PO TABS: 5.0000 mg | ORAL_TABLET | Freq: Two times a day (BID) | ORAL | Status: DC

## 2021-03-19 MED ORDER — MELATONIN 3 MG PO TABS
3.0000 mg | ORAL_TABLET | Freq: Once | ORAL | Status: AC
  Administered 2021-03-19: 21:00:00 3 mg via ORAL
  Filled 2021-03-19: qty 1

## 2021-03-19 NOTE — Progress Notes (Signed)
Left upper extremity venous duplex completed. Refer to "CV Proc" under chart review to view preliminary results.  Preliminary results discussed with Corrie Dandy, RN and Dr. Allena Katz.  03/19/2021 10:00 AM Eula Fried., MHA, RVT, RDCS, RDMS

## 2021-03-19 NOTE — Plan of Care (Signed)
Neuro no charge plan of care note:   Brief HPI: Patient admitted 03/17/21 with fever, shivering, vomitus at bedside incontinence, and decreased responsiveness. Family members had been sick with same symptoms but they did not linger as patient's had. Temp 101 on admission, GCS 10, improved to 15. He stayed somnolent thought to be secondary to benzo and opiate use.    He was thought to have a seizure in the past which was thought to be provoked by benzodiazepine withdrawal. Suspicion for meningitis was r/o by normal CSF/LP. EEG was negative for seizure or epileptiform discharges.   He had another seizure 03/17/21 and was given Ativan. Yesterday, father said patient's mental status was back to baseline "300%" better.   Seizure was thought to be provoked by infection/and/or benzo withdrawal. VA 500mg  po tid was started and will continue on discharge.   Given his improvement in mental status with back to baseline per family, neuro will sign off and be available for questions.   Patient should continue seizure precautions as below and NP made a outpatient referral for recheck 4 weeks after discharge.   Per [facility] statutes, patients with seizures are not allowed to drive until they have been seizure-free for six months.   Use caution when using heavy equipment or power tools. Avoid working on ladders or at heights. Take showers instead of baths. Ensure the water temperature is not too high on the home water heater. Do not go swimming alone. Do not lock yourself in a room alone (i.e. bathroom). When caring for infants or small children, sit down when holding, feeding, or changing them to minimize risk of injury to the child in the event you have a seizure. Maintain good sleep hygiene. Avoid alcohol.    If patient has another seizure, call 911 and bring them back to the ED if: A.  The seizure lasts longer than 5 minutes.      B.  The patient doesn't wake shortly after the seizure or has new  problems such as difficulty seeing, speaking or moving following the seizure C.  The patient was injured during the seizure D.  The patient has a temperature over 102 F (39C) E.  The patient vomited during the seizure and now is having trouble breathing

## 2021-03-19 NOTE — Progress Notes (Signed)
1700 Pt transferred to progressive care with phone, charger, and bag of clothing. Pt resting in bed, NAD.
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
## 2021-03-19 NOTE — Progress Notes (Addendum)
ANTICOAGULATION CONSULT NOTE - Initial Consult  Pharmacy Consult for apixaban Indication: L brachial DVT  No Known Allergies  Patient Measurements: Height: 6\' 2"  (188 cm) Weight: 83.7 kg (184 lb 8.4 oz) IBW/kg (Calculated) : 82.2 Heparin Dosing Weight:   Vital Signs: Temp: 99.7 F (37.6 C) (12/29 0800) BP: 133/69 (12/29 1000) Pulse Rate: 50 (12/29 0800)  Labs: Recent Labs    03/17/21 0320 03/18/21 0306 03/19/21 0434  HGB 12.8* 13.4 13.2  HCT 37.3* 39.5 37.9*  PLT 237 228 247  CREATININE 0.95 0.74 0.76    Estimated Creatinine Clearance: 167 mL/min (by C-G formula based on SCr of 0.76 mg/dL).   Medical History: Past Medical History:  Diagnosis Date   Anxiety 2018   Depression    Substance abuse (HCC)    hx or heroin use, still occasionally takes pain medication    Medications:  Medications Prior to Admission  Medication Sig Dispense Refill Last Dose   ALPRAZolam (XANAX) 0.5 MG tablet Take 0.5 mg by mouth at bedtime as needed for anxiety. (Patient not taking: Reported on 03/17/2021)   Not Taking   ALPRAZolam (XANAX) 1 MG tablet Take 1 tablet (1 mg total) by mouth 2 (two) times daily as needed for anxiety. (Patient not taking: Reported on 03/17/2021) 20 tablet 0 Not Taking   cyclobenzaprine (FLEXERIL) 10 MG tablet Take 1 tablet (10 mg total) by mouth 2 (two) times daily as needed for muscle spasms. (Patient not taking: Reported on 03/17/2021) 30 tablet 0 Not Taking   hydrOXYzine (ATARAX) 25 MG tablet Take 25 mg by mouth 3 (three) times daily as needed for anxiety. (Patient not taking: Reported on 03/17/2021)   Not Taking   meloxicam (MOBIC) 15 MG tablet Take 15 mg by mouth daily. (Patient not taking: Reported on 03/17/2021)   Not Taking   SUBOXONE 8-2 MG FILM Place 0.5 each under the tongue daily. (Patient not taking: Reported on 03/17/2021)   Not Taking    Assessment: 25 yo w/ L brachial DVT to start apixaban.  CBC WNL, SCr WNL.   Goal of Therapy:  Monitor  platelets by anticoagulation protocol: Yes   Plan:  Apixaban 10 mg po bid x 7 days followed by apixaban 5 mg po bid to start Thursday 03/26/2021 Will educate and provide 30 day free card prior to discharge  05/24/2021, Pharm.D 03/19/2021 11:03 AM  Addendum:  Apixaban discontinued: plan is to observe per MD  03/21/2021, Pharm.D 03/19/2021 11:21 AM

## 2021-03-20 LAB — CULTURE, BLOOD (ROUTINE X 2)
Culture: NO GROWTH
Special Requests: ADEQUATE

## 2021-03-20 LAB — CSF CULTURE W GRAM STAIN
Culture: NO GROWTH
Gram Stain: NONE SEEN

## 2021-03-20 MED ORDER — THIAMINE HCL 100 MG PO TABS: 100.0000 mg | ORAL_TABLET | Freq: Every day | ORAL | 0 refills | Status: DC

## 2021-03-20 MED ORDER — ADULT MULTIVITAMIN W/MINERALS CH: 1.0000 | ORAL_TABLET | Freq: Every day | ORAL | 0 refills | Status: AC

## 2021-03-20 MED ORDER — CHLORDIAZEPOXIDE HCL 10 MG PO CAPS: ORAL_CAPSULE | ORAL | 0 refills | Status: AC

## 2021-03-20 MED ORDER — TRAZODONE HCL 50 MG PO TABS: 50.0000 mg | ORAL_TABLET | Freq: Every day | ORAL | 0 refills | Status: DC

## 2021-03-20 MED ORDER — FOLIC ACID 1 MG PO TABS: 1.0000 mg | ORAL_TABLET | Freq: Every day | ORAL | 0 refills | Status: DC

## 2021-03-20 MED ORDER — DIVALPROEX SODIUM 500 MG PO DR TAB: 500.0000 mg | DELAYED_RELEASE_TABLET | Freq: Three times a day (TID) | ORAL | 0 refills | Status: DC

## 2021-03-20 NOTE — TOC Transition Note (Signed)
Transition of Care [facility]) - CM/SW Discharge Note   Patient Details  Name: Jeremy Short MRN: [id number] Date of Birth: Apr 02, 1995  Transition of [facility] CM/SW Contact:  Golda Acre, RN Phone Number: 03/20/2021, 12:32 PM   Clinical Narrative:    Patient dcd to home with parents.  No toc needs.   Final next level of care: Home/Self Care Barriers to Discharge: No Barriers Identified   Patient Goals and CMS Choice Patient states their goals for this hospitalization and ongoing recovery are:: to get bette CMS Medicare.gov Compare Post Acute Care list provided to:: Patient Choice offered to / list presented to : Patient  Discharge Placement                       Discharge Plan and Services                                     Social Determinants of Health (SDOH) Interventions     Readmission Risk Interventions No flowsheet data found.

## 2021-03-21 LAB — CULTURE, BLOOD (ROUTINE X 2)
Culture: NO GROWTH
Special Requests: ADEQUATE
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
## 2021-08-05 ENCOUNTER — Emergency Department (HOSPITAL_COMMUNITY)
Admission: EM | Admit: 2021-08-05 | Discharge: 2021-08-05 | Disposition: A | Discharge status: Home / Self Care | Payer: Medicaid Other | Attending: Emergency Medicine | Admitting: Emergency Medicine

## 2021-08-05 ENCOUNTER — Other Ambulatory Visit: Payer: Self-pay

## 2021-08-05 ENCOUNTER — Encounter (HOSPITAL_COMMUNITY): Payer: Self-pay | Admitting: Emergency Medicine

## 2021-08-05 ENCOUNTER — Emergency Department (HOSPITAL_COMMUNITY): Payer: Medicaid Other

## 2021-08-05 DIAGNOSIS — S92245B Nondisplaced fracture of medial cuneiform of left foot, initial encounter for open fracture: Secondary | ICD-10-CM | POA: Diagnosis not present

## 2021-08-05 DIAGNOSIS — S99922A Unspecified injury of left foot, initial encounter: Secondary | ICD-10-CM | POA: Diagnosis present

## 2021-08-05 DIAGNOSIS — X748XXA Intentional self-harm by other firearm discharge, initial encounter: Secondary | ICD-10-CM | POA: Diagnosis not present

## 2021-08-05 DIAGNOSIS — Z23 Encounter for immunization: Secondary | ICD-10-CM | POA: Insufficient documentation

## 2021-08-05 DIAGNOSIS — Y249XXA Unspecified firearm discharge, undetermined intent, initial encounter: Secondary | ICD-10-CM

## 2021-08-05 LAB — CBC WITH DIFFERENTIAL/PLATELET
Abs Immature Granulocytes: 0.02 10*3/uL (ref 0.00–0.07)
Basophils Absolute: 0 10*3/uL (ref 0.0–0.1)
Basophils Relative: 1 %
Eosinophils Absolute: 0.3 10*3/uL (ref 0.0–0.5)
Eosinophils Relative: 3 %
HCT: 38.2 % — ABNORMAL LOW (ref 39.0–52.0)
Hemoglobin: 12.6 g/dL — ABNORMAL LOW (ref 13.0–17.0)
Immature Granulocytes: 0 %
Lymphocytes Relative: 26 %
Lymphs Abs: 2.2 10*3/uL (ref 0.7–4.0)
MCH: 29.9 pg (ref 26.0–34.0)
MCHC: 33 g/dL (ref 30.0–36.0)
MCV: 90.7 fL (ref 80.0–100.0)
Monocytes Absolute: 0.8 10*3/uL (ref 0.1–1.0)
Monocytes Relative: 9 %
Neutro Abs: 5.1 10*3/uL (ref 1.7–7.7)
Neutrophils Relative %: 61 %
Platelets: 182 10*3/uL (ref 150–400)
RBC: 4.21 MIL/uL — ABNORMAL LOW (ref 4.22–5.81)
RDW: 12.4 % (ref 11.5–15.5)
WBC: 8.3 10*3/uL (ref 4.0–10.5)
nRBC: 0 % (ref 0.0–0.2)

## 2021-08-05 LAB — BASIC METABOLIC PANEL
Anion gap: 6 (ref 5–15)
BUN: 11 mg/dL (ref 6–20)
CO2: 30 mmol/L (ref 22–32)
Calcium: 9.2 mg/dL (ref 8.9–10.3)
Chloride: 107 mmol/L (ref 98–111)
Creatinine, Ser: 0.76 mg/dL (ref 0.61–1.24)
GFR, Estimated: >60 mL/min (ref >60)
Glucose, Bld: 66 mg/dL — ABNORMAL LOW (ref 70–99)
Potassium: 3.8 mmol/L (ref 3.5–5.1)
Sodium: 143 mmol/L (ref 135–145)

## 2021-08-05 MED ORDER — FENTANYL CITRATE PF 50 MCG/ML IJ SOSY
50.0000 ug | PREFILLED_SYRINGE | Freq: Once | INTRAMUSCULAR | Status: AC
  Administered 2021-08-05: 50 ug via INTRAVENOUS
  Filled 2021-08-05: qty 1

## 2021-08-05 MED ORDER — CEPHALEXIN 500 MG PO CAPS: 500.0000 mg | ORAL_CAPSULE | Freq: Three times a day (TID) | ORAL | 0 refills | Status: AC

## 2021-08-05 MED ORDER — SODIUM CHLORIDE 0.9 % IV BOLUS
1000.0000 mL | Freq: Once | INTRAVENOUS | Status: AC
  Administered 2021-08-05: 1000 mL via INTRAVENOUS

## 2021-08-05 MED ORDER — ACETAMINOPHEN 325 MG PO TABS
650.0000 mg | ORAL_TABLET | Freq: Four times a day (QID) | ORAL | 0 refills | Status: AC | PRN
Start: 2021-08-05 — End: ?

## 2021-08-05 MED ORDER — CEFAZOLIN SODIUM-DEXTROSE 2-4 GM/100ML-% IV SOLN
2.0000 g | Freq: Once | INTRAVENOUS | Status: AC
  Administered 2021-08-05: 2 g via INTRAVENOUS
  Filled 2021-08-05: qty 100

## 2021-08-05 MED ORDER — IBUPROFEN 600 MG PO TABS: 600.0000 mg | ORAL_TABLET | Freq: Four times a day (QID) | ORAL | 0 refills | Status: DC | PRN

## 2021-08-05 MED ORDER — ONDANSETRON HCL 4 MG/2ML IJ SOLN
4.0000 mg | Freq: Once | INTRAMUSCULAR | Status: AC
Start: 2021-08-05 — End: 2021-08-05
  Administered 2021-08-05: 4 mg via INTRAVENOUS
  Filled 2021-08-05: qty 2

## 2021-08-05 MED ORDER — OXYCODONE HCL 5 MG PO TABS: 5.0000 mg | ORAL_TABLET | ORAL | 0 refills | Status: DC | PRN

## 2021-08-05 MED ORDER — TETANUS-DIPHTH-ACELL PERTUSSIS 5-2.5-18.5 LF-MCG/0.5 IM SUSY
0.5000 mL | PREFILLED_SYRINGE | Freq: Once | INTRAMUSCULAR | Status: AC
  Administered 2021-08-05: 0.5 mL via INTRAMUSCULAR
  Filled 2021-08-05: qty 0.5

## 2021-08-05 MED ORDER — HYDROMORPHONE HCL 1 MG/ML IJ SOLN
1.0000 mg | Freq: Once | INTRAMUSCULAR | Status: AC
  Administered 2021-08-05: 1 mg via INTRAVENOUS
  Filled 2021-08-05: qty 1

## 2021-08-05 NOTE — ED Notes (Signed)
Pt provided with apple juice per RN request. ?
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
## 2021-08-05 NOTE — ED Notes (Signed)
Discharge instructions reviewed, questions answered. Rx education provided. Pt states understanding and no further questions. Pt ambulatory with steady gait with crutches and walking boot upon discharge. No s/s of distress noted.  ?

## 2021-08-05 NOTE — ED Triage Notes (Signed)
Patient presents with wounds to the left foot. He will not tell triage RN how he injured his foot. Incident happened a few hours ago. Foot is swollen and bleeding control.   ? ? ? ? ?
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
## 2021-08-05 NOTE — ED Notes (Signed)
Apple juice provided for BS of 66. ?

## 2021-08-05 NOTE — Discharge Instructions (Signed)
It was a pleasure caring for you today in the emergency department. ° °Please return to the emergency department for any worsening or worrisome symptoms. ° ° °

## 2021-08-10 ENCOUNTER — Other Ambulatory Visit: Payer: Self-pay | Admitting: Orthopaedic Surgery

## 2021-08-10 DIAGNOSIS — M79672 Pain in left foot: Secondary | ICD-10-CM

## 2021-08-14 ENCOUNTER — Other Ambulatory Visit: Payer: Medicaid Other

## 2021-08-19 ENCOUNTER — Ambulatory Visit
Admission: RE | Admit: 2021-08-19 | Discharge: 2021-08-19 | Disposition: A | Discharge status: Home / Self Care | Payer: Medicaid Other | Source: Ambulatory Visit | Attending: Orthopaedic Surgery | Admitting: Orthopaedic Surgery

## 2021-08-19 DIAGNOSIS — M79672 Pain in left foot: Secondary | ICD-10-CM

## 2021-09-11 ENCOUNTER — Inpatient Hospital Stay (HOSPITAL_COMMUNITY)
Admission: EM | Admit: 2021-09-11 | Discharge: 2021-09-15 | DRG: 871 | Disposition: A | Discharge status: Home / Self Care | Payer: Medicaid Other | Attending: Internal Medicine | Admitting: Internal Medicine

## 2021-09-11 ENCOUNTER — Encounter (HOSPITAL_COMMUNITY): Payer: Self-pay | Admitting: Emergency Medicine

## 2021-09-11 ENCOUNTER — Emergency Department (HOSPITAL_COMMUNITY): Payer: Medicaid Other

## 2021-09-11 DIAGNOSIS — J690 Pneumonitis due to inhalation of food and vomit: Secondary | ICD-10-CM

## 2021-09-11 DIAGNOSIS — E876 Hypokalemia: Secondary | ICD-10-CM | POA: Diagnosis present

## 2021-09-11 DIAGNOSIS — R1110 Vomiting, unspecified: Secondary | ICD-10-CM

## 2021-09-11 DIAGNOSIS — G249 Dystonia, unspecified: Secondary | ICD-10-CM | POA: Diagnosis present

## 2021-09-11 DIAGNOSIS — J189 Pneumonia, unspecified organism: Secondary | ICD-10-CM

## 2021-09-11 DIAGNOSIS — Z20822 Contact with and (suspected) exposure to covid-19: Secondary | ICD-10-CM | POA: Diagnosis present

## 2021-09-11 DIAGNOSIS — Z79899 Other long term (current) drug therapy: Secondary | ICD-10-CM

## 2021-09-11 DIAGNOSIS — R001 Bradycardia, unspecified: Secondary | ICD-10-CM

## 2021-09-11 DIAGNOSIS — F13939 Sedative, hypnotic or anxiolytic use, unspecified with withdrawal, unspecified: Secondary | ICD-10-CM

## 2021-09-11 DIAGNOSIS — F13239 Sedative, hypnotic or anxiolytic dependence with withdrawal, unspecified: Secondary | ICD-10-CM | POA: Diagnosis present

## 2021-09-11 DIAGNOSIS — F1210 Cannabis abuse, uncomplicated: Secondary | ICD-10-CM | POA: Diagnosis present

## 2021-09-11 DIAGNOSIS — R569 Unspecified convulsions: Secondary | ICD-10-CM | POA: Diagnosis present

## 2021-09-11 DIAGNOSIS — R6520 Severe sepsis without septic shock: Secondary | ICD-10-CM | POA: Diagnosis present

## 2021-09-11 DIAGNOSIS — A419 Sepsis, unspecified organism: Principal | ICD-10-CM

## 2021-09-11 DIAGNOSIS — J9601 Acute respiratory failure with hypoxia: Secondary | ICD-10-CM | POA: Diagnosis present

## 2021-09-11 DIAGNOSIS — F1123 Opioid dependence with withdrawal: Secondary | ICD-10-CM | POA: Diagnosis present

## 2021-09-11 DIAGNOSIS — F1193 Opioid use, unspecified with withdrawal: Principal | ICD-10-CM | POA: Diagnosis present

## 2021-09-11 LAB — COMPREHENSIVE METABOLIC PANEL
ALT: 16 U/L (ref 0–44)
AST: 19 U/L (ref 15–41)
Albumin: 4.5 g/dL (ref 3.5–5.0)
Alkaline Phosphatase: 84 U/L (ref 38–126)
Anion gap: 13 (ref 5–15)
BUN: 13 mg/dL (ref 6–20)
CO2: 25 mmol/L (ref 22–32)
Calcium: 9.6 mg/dL (ref 8.9–10.3)
Chloride: 108 mmol/L (ref 98–111)
Creatinine, Ser: 0.92 mg/dL (ref 0.61–1.24)
GFR, Estimated: >60 mL/min (ref >60)
Glucose, Bld: 124 mg/dL — ABNORMAL HIGH (ref 70–99)
Potassium: 3.6 mmol/L (ref 3.5–5.1)
Sodium: 146 mmol/L — ABNORMAL HIGH (ref 135–145)
Total Bilirubin: 0.7 mg/dL (ref 0.3–1.2)
Total Protein: 7.8 g/dL (ref 6.5–8.1)

## 2021-09-11 LAB — CBC
HCT: 44.9 % (ref 39.0–52.0)
Hemoglobin: 14.9 g/dL (ref 13.0–17.0)
MCH: 28.8 pg (ref 26.0–34.0)
MCHC: 33.2 g/dL (ref 30.0–36.0)
MCV: 86.7 fL (ref 80.0–100.0)
Platelets: 266 10*3/uL (ref 150–400)
RBC: 5.18 MIL/uL (ref 4.22–5.81)
RDW: 12.1 % (ref 11.5–15.5)
WBC: 21.4 10*3/uL — ABNORMAL HIGH (ref 4.0–10.5)
nRBC: 0 % (ref 0.0–0.2)

## 2021-09-11 LAB — ACETAMINOPHEN LEVEL: Acetaminophen (Tylenol), Serum: <10 ug/mL — ABNORMAL LOW (ref 10–30)

## 2021-09-11 LAB — SALICYLATE LEVEL: Salicylate Lvl: <7 mg/dL — ABNORMAL LOW (ref 7.0–30.0)

## 2021-09-11 LAB — ETHANOL: Alcohol, Ethyl (B): <10 mg/dL (ref <10)

## 2021-09-11 LAB — SARS CORONAVIRUS 2 BY RT PCR: SARS Coronavirus 2 by RT PCR: NEGATIVE

## 2021-09-11 LAB — VALPROIC ACID LEVEL: Valproic Acid Lvl: <10 ug/mL — ABNORMAL LOW (ref 50.0–100.0)

## 2021-09-11 MED ORDER — LOPERAMIDE HCL 2 MG PO CAPS: 2.0000 mg | ORAL_CAPSULE | ORAL | Status: DC | PRN

## 2021-09-11 MED ORDER — ONDANSETRON HCL 4 MG/2ML IJ SOLN
4.0000 mg | Freq: Once | INTRAMUSCULAR | Status: AC
  Administered 2021-09-11: 4 mg via INTRAVENOUS
  Filled 2021-09-11: qty 2

## 2021-09-11 MED ORDER — CLONIDINE HCL 0.1 MG PO TABS
0.1000 mg | ORAL_TABLET | Freq: Once | ORAL | Status: DC
  Filled 2021-09-11: qty 1

## 2021-09-11 MED ORDER — HYDROXYZINE HCL 25 MG PO TABS: 25.0000 mg | ORAL_TABLET | Freq: Four times a day (QID) | ORAL | Status: DC | PRN

## 2021-09-11 MED ORDER — ONDANSETRON 4 MG PO TBDP: 4.0000 mg | ORAL_TABLET | Freq: Four times a day (QID) | ORAL | Status: DC | PRN

## 2021-09-11 MED ORDER — SODIUM CHLORIDE 0.9 % IV SOLN: 1.0000 g | Freq: Once | INTRAVENOUS | Status: DC

## 2021-09-11 MED ORDER — IPRATROPIUM BROMIDE 0.02 % IN SOLN: 0.5000 mg | Freq: Once | RESPIRATORY_TRACT | Status: DC

## 2021-09-11 MED ORDER — SODIUM CHLORIDE 0.9 % IV SOLN: 500.0000 mg | Freq: Once | INTRAVENOUS | Status: DC

## 2021-09-11 MED ORDER — METHOCARBAMOL 500 MG PO TABS
500.0000 mg | ORAL_TABLET | Freq: Three times a day (TID) | ORAL | Status: DC | PRN
  Administered 2021-09-13: 500 mg via ORAL
  Filled 2021-09-11: qty 1

## 2021-09-11 MED ORDER — SODIUM CHLORIDE 0.9 % IV BOLUS
1000.0000 mL | Freq: Once | INTRAVENOUS | Status: AC
  Administered 2021-09-11: 1000 mL via INTRAVENOUS

## 2021-09-11 MED ORDER — DICYCLOMINE HCL 20 MG PO TABS
20.0000 mg | ORAL_TABLET | Freq: Four times a day (QID) | ORAL | Status: DC | PRN
  Filled 2021-09-11: qty 1

## 2021-09-11 MED ORDER — NAPROXEN 250 MG PO TABS: 500.0000 mg | ORAL_TABLET | Freq: Two times a day (BID) | ORAL | Status: DC | PRN

## 2021-09-11 MED ORDER — ALBUTEROL SULFATE (2.5 MG/3ML) 0.083% IN NEBU
5.0000 mg | INHALATION_SOLUTION | Freq: Once | RESPIRATORY_TRACT | Status: DC
Start: 2021-09-11 — End: 2021-09-12

## 2021-09-11 MED ORDER — SODIUM CHLORIDE 0.9 % IV BOLUS
1000.0000 mL | Freq: Once | INTRAVENOUS | Status: AC
Start: 2021-09-11 — End: 2021-09-12
  Administered 2021-09-11: 1000 mL via INTRAVENOUS

## 2021-09-11 NOTE — ED Triage Notes (Signed)
The patient presents from home via EMS due to family concerns that he was foaming at the mouth. When EMS arrived he was instructed to swallow or spit out the extra saliva. He chose to spit it out. The patient is currently working with a outpatient detox program and has not had opioids in 60 hours. He complains of general pain and nausea. EMS administered 50 ml of fluid.      EMS vitals: 48-60 HR 99% RA 40 RR 165 CBG 136/78 BP

## 2021-09-11 NOTE — ED Notes (Signed)
Patient refuses to swallow.

## 2021-09-11 NOTE — H&P (Addendum)
History and Physical    Jeremy Short YQM:[id number] DOB: Jan 02, 1996 DOA: 09/11/2021  PCP: Pcp, No  Patient coming from: Home  Chief Complaint: Withdrawal  HPI: Jeremy Short is a 26 y.o. male with medical history significant of anxiety, depression, polysubstance abuse (opiates, marijuana, benzodiazepines), history of benzodiazepine withdrawal seizures presenting to the ED via EMS due to concern for opiate withdrawal.  Patient stopped using heroin 60 hours ago and is arranged to be in a rehab program soon.  Reported foaming at the mouth, poor p.o. intake, vomiting, and loose stools.  In the ED, patient afebrile.  Mildly bradycardic with heart rate in the 50s to 60s.  Oxygen saturation as low as 90% on room air.  Labs notable for WBC 21.4, sodium 146, normal LFTs.  Blood ethanol, salicylate, and acetaminophen levels undetectable.  Valproic acid level <10.  UA and UDS pending.  SARS-CoV-2 PCR pending.  Blood cultures pending.  Chest x-ray showing patchy opacity in the left lower lung concerning for pneumonia. Patient was given Zofran and 3 L normal saline boluses.  Started on clonidine detox protocol.  Patient is not able to give any history.  Mother at bedside states he stopped using heroin 3 days ago as he is enrolled in a rehab program.  She is not sure if he is taking benzodiazepines or any other drugs at home.  Not sure if he is taking Depakote.  She is concerned that he may be having seizures has been clenching his jaw, crossing his eyes, and collecting saliva in his mouth.  He started vomiting today.  Review of Systems:  Review of Systems  All other systems reviewed and are negative.   Past Medical History:  Diagnosis Date   Anxiety 2018   Depression    Substance abuse (HCC)    hx or heroin use, still occasionally takes pain medication    Past Surgical History:  Procedure Laterality Date   LUMBAR LAMINECTOMY/DECOMPRESSION MICRODISCECTOMY Left 02/23/2021   Procedure:  LAMINOTOMY, MICRODISCECTOMY, L45, L5S1;  Surgeon: Lisbeth Renshaw, MD;  Location: [facility] OR;  Service: Neurosurgery;  Laterality: Left;  3C     reports that he has never smoked. He has never used smokeless tobacco. He reports that he does not currently use alcohol. He reports current drug use. Drugs: Marijuana and Oxycodone.  No Known Allergies  Family History  Family history unknown: Yes    Prior to Admission medications   Medication Sig Start Date End Date Taking? Authorizing Provider  acetaminophen (TYLENOL) 325 MG tablet Take 2 tablets (650 mg total) by mouth every 6 (six) hours as needed. 08/05/21   Sloan Leiter, DO  divalproex (DEPAKOTE) 500 MG DR tablet Take 1 tablet (500 mg total) by mouth 3 (three) times daily. 03/20/21   Rolly Salter, MD  folic acid (FOLVITE) 1 MG tablet Take 1 tablet (1 mg total) by mouth daily. 03/21/21   Rolly Salter, MD  ibuprofen (ADVIL) 600 MG tablet Take 1 tablet (600 mg total) by mouth every 6 (six) hours as needed. 08/05/21   Sloan Leiter, DO  meloxicam (MOBIC) 15 MG tablet Take 15 mg by mouth daily. Patient not taking: Reported on 03/17/2021    [provider]  Multiple Vitamin (MULTIVITAMIN WITH MINERALS) TABS tablet Take 1 tablet by mouth daily. 03/21/21   Rolly Salter, MD  oxyCODONE (ROXICODONE) 5 MG immediate release tablet Take 1 tablet (5 mg total) by mouth every 4 (four) hours as needed for severe pain. 08/05/21  Tanda Rockers A, DO  thiamine 100 MG tablet Take 1 tablet (100 mg total) by mouth daily. 03/21/21   Rolly Salter, MD  traZODone (DESYREL) 50 MG tablet Take 1 tablet (50 mg total) by mouth at bedtime. 03/20/21   Rolly Salter, MD    Physical Exam: Vitals:   09/11/21 2130 09/11/21 2200 09/11/21 2215 09/11/21 2230  BP: 127/79 133/66  133/77  Pulse: (!) 53 (!) 53 63 (!) 57  Resp: (!) 30 (!) 28 (!) 41 (!) 35  Temp:      TempSrc:      SpO2: 93% 97% 90% 90%    Physical Exam Vitals reviewed.  Constitutional:       General: He is not in acute distress. HENT:     Head: Normocephalic and atraumatic.  Eyes:     Extraocular Movements: Extraocular movements intact.  Cardiovascular:     Rate and Rhythm: Normal rate and regular rhythm.     Pulses: Normal pulses.  Pulmonary:     Effort: Pulmonary effort is normal. No respiratory distress.     Breath sounds: No wheezing.  Abdominal:     General: Bowel sounds are normal. There is no distension.     Palpations: Abdomen is soft.     Tenderness: There is no abdominal tenderness. There is no guarding or rebound.  Musculoskeletal:        General: No swelling or tenderness.     Cervical back: Normal range of motion.  Skin:    General: Skin is warm and dry.  Neurological:     Mental Status: He is alert.     Cranial Nerves: No cranial nerve deficit.     Motor: No weakness.     Comments: Nonverbal however following commands appropriately Clenching his jaw however able to open his mouth when instructed      Labs on Admission: I have personally reviewed following labs and imaging studies  CBC: Recent Labs  Lab 09/11/21 1810  WBC 21.4*  HGB 14.9  HCT 44.9  MCV 86.7  PLT 266   Basic Metabolic Panel: Recent Labs  Lab 09/11/21 1810  NA 146*  K 3.6  CL 108  CO2 25  GLUCOSE 124*  BUN 13  CREATININE 0.92  CALCIUM 9.6   GFR: CrCl cannot be calculated (Unknown ideal weight.). Liver Function Tests: Recent Labs  Lab 09/11/21 1810  AST 19  ALT 16  ALKPHOS 84  BILITOT 0.7  PROT 7.8  ALBUMIN 4.5   No results for input(s): "LIPASE", "AMYLASE" in the last 168 hours. No results for input(s): "AMMONIA" in the last 168 hours. Coagulation Profile: No results for input(s): "INR", "PROTIME" in the last 168 hours. Cardiac Enzymes: No results for input(s): "CKTOTAL", "CKMB", "CKMBINDEX", "TROPONINI" in the last 168 hours. BNP (last 3 results) No results for input(s): "PROBNP" in the last 8760 hours. HbA1C: No results for input(s): "HGBA1C" in  the last 72 hours. CBG: No results for input(s): "GLUCAP" in the last 168 hours. Lipid Profile: No results for input(s): "CHOL", "HDL", "LDLCALC", "TRIG", "CHOLHDL", "LDLDIRECT" in the last 72 hours. Thyroid Function Tests: No results for input(s): "TSH", "T4TOTAL", "FREET4", "T3FREE", "THYROIDAB" in the last 72 hours. Anemia Panel: No results for input(s): "VITAMINB12", "FOLATE", "FERRITIN", "TIBC", "IRON", "RETICCTPCT" in the last 72 hours. Urine analysis:    Component Value Date/Time   COLORURINE YELLOW 03/15/2021 1408   APPEARANCEUR CLEAR 03/15/2021 1408   LABSPEC 1.030 03/15/2021 1408   PHURINE 6.0 03/15/2021 1408  GLUCOSEU NEGATIVE 03/15/2021 1408   HGBUR NEGATIVE 03/15/2021 1408   BILIRUBINUR NEGATIVE 03/15/2021 1408   KETONESUR 20 (A) 03/15/2021 1408   PROTEINUR NEGATIVE 03/15/2021 1408   UROBILINOGEN 1.0 07/24/2014 2114   NITRITE NEGATIVE 03/15/2021 1408   LEUKOCYTESUR NEGATIVE 03/15/2021 1408    Radiological Exams on Admission: I have personally reviewed images DG Chest 2 View  Result Date: 09/11/2021 CLINICAL DATA:  Foaming at the mouth EXAM: CHEST - 2 VIEW COMPARISON:  03/17/2021 FINDINGS: No pleural effusion. Patchy opacity in the left lower lung. Normal cardiac size. No pneumothorax. IMPRESSION: Patchy opacity in the left lower lung, possible pneumonia Electronically Signed   By: Jasmine Pang M.D.   On: 09/11/2021 22:28    EKG: Independently reviewed.  Sinus bradycardia.  Assessment and Plan  Severe sepsis secondary to aspiration pneumonia Acute hypoxic respiratory failure -Meets criteria for severe sepsis with tachypnea, leukocytosis, and acute hypoxia.   -Oxygen saturation as low as 90% on room air in the ED, currently satting well on 1 L supplemental oxygen.  -Chest x-ray showing patchy opacity in the left lower lung concerning for possible aspiration pneumonia in the setting of vomiting.   -Start Unasyn -Patient was given 3 L IV fluid boluses in the ED.   Continue IV fluid hydration. -Blood cultures pending -Monitor WBC count -Check lactate -Supplemental oxygen as needed to keep oxygen saturation above 92% -N.p.o. 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  Valproic acid level <10.  At present, patient has his eyes crossed and is clenching his jaw/grinding teeth, no tonic-clonic seizure-like activity.  He is nonverbal but otherwise following commands appropriately and opening his mouth when instructed to do so.  No fever or meningeal signs.  Discussed with on-call neurologist who feels seizure is less likely as patient is able to follow commands appropriately.  He recommends continuing his home dose of Depakote. -Continue Depakote 500 mg 3 times a day -Ativan withdrawal protocol -UDS pending.  Stat serum drug screen also ordered. -Stat CT head ordered -Seizure precautions -EEG  Emesis Likely due to opiate withdrawal.  Abdominal exam benign.  LFTs normal. -Check lipase level -Antiemetic as needed  Sinus bradycardia Mildly bradycardic with heart rate in the 50s to 60s.  Not on any AV nodal blocking agents and blood pressure stable. -Cardiac monitoring Addendum 09/12/2021 at 1:08 AM: Vitals from previous hospitalizations reviewed and it seems bradycardia is not new at this resting heart rate was in the 40s to 60s in the past as well.  DVT prophylaxis: SCDs at this time as CT head is pending Code Status: Full Code Family Communication: Patient's mother is at bedside and also called his sister who has been updated. Consults called: Discussed case with neurology (Dr. Derry Lory) Level of care: Step Down Unit Admission status: It is my  clinical opinion that referral for OBSERVATION is reasonable and necessary in this patient based on the above information provided. The aforementioned taken together are felt to place the patient at high risk for further clinical deterioration. However, it is anticipated that the patient may be medically stable for discharge from the hospital within 24 to 48 hours.   John Giovanni MD [facility]  If 7PM-7AM, please contact night-coverage [email or web address]  09/11/2021, 11:18 PM

## 2021-09-12 ENCOUNTER — Inpatient Hospital Stay (HOSPITAL_COMMUNITY): Payer: Medicaid Other

## 2021-09-12 DIAGNOSIS — R001 Bradycardia, unspecified: Secondary | ICD-10-CM

## 2021-09-12 DIAGNOSIS — R6520 Severe sepsis without septic shock: Secondary | ICD-10-CM

## 2021-09-12 DIAGNOSIS — Z79899 Other long term (current) drug therapy: Secondary | ICD-10-CM | POA: Diagnosis not present

## 2021-09-12 DIAGNOSIS — R1110 Vomiting, unspecified: Secondary | ICD-10-CM

## 2021-09-12 DIAGNOSIS — F13939 Sedative, hypnotic or anxiolytic use, unspecified with withdrawal, unspecified: Secondary | ICD-10-CM

## 2021-09-12 DIAGNOSIS — G249 Dystonia, unspecified: Secondary | ICD-10-CM | POA: Diagnosis present

## 2021-09-12 DIAGNOSIS — E876 Hypokalemia: Secondary | ICD-10-CM | POA: Diagnosis present

## 2021-09-12 DIAGNOSIS — F1123 Opioid dependence with withdrawal: Secondary | ICD-10-CM | POA: Diagnosis present

## 2021-09-12 DIAGNOSIS — R569 Unspecified convulsions: Secondary | ICD-10-CM | POA: Diagnosis present

## 2021-09-12 DIAGNOSIS — A419 Sepsis, unspecified organism: Secondary | ICD-10-CM | POA: Diagnosis present

## 2021-09-12 DIAGNOSIS — F1210 Cannabis abuse, uncomplicated: Secondary | ICD-10-CM | POA: Diagnosis present

## 2021-09-12 DIAGNOSIS — F1193 Opioid use, unspecified with withdrawal: Secondary | ICD-10-CM

## 2021-09-12 DIAGNOSIS — J9601 Acute respiratory failure with hypoxia: Secondary | ICD-10-CM

## 2021-09-12 DIAGNOSIS — Z20822 Contact with and (suspected) exposure to covid-19: Secondary | ICD-10-CM | POA: Diagnosis present

## 2021-09-12 DIAGNOSIS — J690 Pneumonitis due to inhalation of food and vomit: Secondary | ICD-10-CM | POA: Diagnosis present

## 2021-09-12 DIAGNOSIS — F13239 Sedative, hypnotic or anxiolytic dependence with withdrawal, unspecified: Secondary | ICD-10-CM | POA: Diagnosis present

## 2021-09-12 LAB — URINALYSIS, ROUTINE W REFLEX MICROSCOPIC
Bacteria, UA: NONE SEEN
Bilirubin Urine: NEGATIVE
Glucose, UA: NEGATIVE mg/dL
Hgb urine dipstick: NEGATIVE
Ketones, ur: 20 mg/dL — AB
Nitrite: NEGATIVE
Protein, ur: 30 mg/dL — AB
Specific Gravity, Urine: 1.034 — ABNORMAL HIGH (ref 1.005–1.030)
pH: 7 (ref 5.0–8.0)

## 2021-09-12 LAB — CBC
HCT: 40 % (ref 39.0–52.0)
Hemoglobin: 13.3 g/dL (ref 13.0–17.0)
MCH: 29.4 pg (ref 26.0–34.0)
MCHC: 33.3 g/dL (ref 30.0–36.0)
MCV: 88.5 fL (ref 80.0–100.0)
Platelets: 283 10*3/uL (ref 150–400)
RBC: 4.52 MIL/uL (ref 4.22–5.81)
RDW: 12.4 % (ref 11.5–15.5)
WBC: 23 10*3/uL — ABNORMAL HIGH (ref 4.0–10.5)
nRBC: 0 % (ref 0.0–0.2)

## 2021-09-12 LAB — BASIC METABOLIC PANEL
Anion gap: 11 (ref 5–15)
BUN: 13 mg/dL (ref 6–20)
CO2: 21 mmol/L — ABNORMAL LOW (ref 22–32)
Calcium: 8.7 mg/dL — ABNORMAL LOW (ref 8.9–10.3)
Chloride: 113 mmol/L — ABNORMAL HIGH (ref 98–111)
Creatinine, Ser: 0.89 mg/dL (ref 0.61–1.24)
GFR, Estimated: >60 mL/min (ref >60)
Glucose, Bld: 145 mg/dL — ABNORMAL HIGH (ref 70–99)
Potassium: 3.3 mmol/L — ABNORMAL LOW (ref 3.5–5.1)
Sodium: 145 mmol/L (ref 135–145)

## 2021-09-12 LAB — LACTIC ACID, PLASMA
Lactic Acid, Venous: 3.1 mmol/L (ref 0.5–1.9)
Lactic Acid, Venous: 4.4 mmol/L (ref 0.5–1.9)

## 2021-09-12 LAB — RAPID URINE DRUG SCREEN, HOSP PERFORMED
Amphetamines: NOT DETECTED
Barbiturates: NOT DETECTED
Benzodiazepines: POSITIVE — AB
Cocaine: NOT DETECTED
Opiates: POSITIVE — AB
Tetrahydrocannabinol: POSITIVE — AB

## 2021-09-12 LAB — MAGNESIUM: Magnesium: 2 mg/dL (ref 1.7–2.4)

## 2021-09-12 LAB — LIPASE, BLOOD: Lipase: 23 U/L (ref 11–51)

## 2021-09-12 LAB — MRSA NEXT GEN BY PCR, NASAL: MRSA by PCR Next Gen: NOT DETECTED

## 2021-09-12 MED ORDER — ONDANSETRON HCL 4 MG/2ML IJ SOLN
4.0000 mg | Freq: Four times a day (QID) | INTRAMUSCULAR | Status: DC | PRN
  Administered 2021-09-12 – 2021-09-13 (×5): 4 mg via INTRAVENOUS
  Filled 2021-09-12 (×5): qty 2

## 2021-09-12 MED ORDER — PROCHLORPERAZINE EDISYLATE 10 MG/2ML IJ SOLN
10.0000 mg | INTRAMUSCULAR | Status: AC | PRN
  Administered 2021-09-12 – 2021-09-13 (×2): 10 mg via INTRAVENOUS
  Filled 2021-09-12 (×2): qty 2

## 2021-09-12 MED ORDER — CLONIDINE HCL 0.1 MG PO TABS: 0.1000 mg | ORAL_TABLET | ORAL | Status: DC

## 2021-09-12 MED ORDER — CHLORHEXIDINE GLUCONATE CLOTH 2 % EX PADS
6.0000 | MEDICATED_PAD | Freq: Every day | CUTANEOUS | Status: DC
Start: 2021-09-12 — End: 2021-09-15
  Administered 2021-09-12 – 2021-09-14 (×3): 6 via TOPICAL

## 2021-09-12 MED ORDER — ORAL CARE MOUTH RINSE
15.0000 mL | OROMUCOSAL | Status: DC
  Administered 2021-09-12 – 2021-09-14 (×7): 15 mL via OROMUCOSAL

## 2021-09-12 MED ORDER — THIAMINE HCL 100 MG PO TABS
100.0000 mg | ORAL_TABLET | Freq: Every day | ORAL | Status: DC
  Administered 2021-09-14 – 2021-09-15 (×2): 100 mg via ORAL
  Filled 2021-09-12 (×2): qty 1

## 2021-09-12 MED ORDER — FOLIC ACID 1 MG PO TABS
1.0000 mg | ORAL_TABLET | Freq: Every day | ORAL | Status: DC
  Administered 2021-09-14 – 2021-09-15 (×2): 1 mg via ORAL
  Filled 2021-09-12 (×3): qty 1

## 2021-09-12 MED ORDER — ACETAMINOPHEN 650 MG RE SUPP: 650.0000 mg | Freq: Four times a day (QID) | RECTAL | Status: DC | PRN

## 2021-09-12 MED ORDER — POTASSIUM CHLORIDE 10 MEQ/100ML IV SOLN
10.0000 meq | INTRAVENOUS | Status: AC
  Administered 2021-09-12 (×2): 10 meq via INTRAVENOUS
  Filled 2021-09-12 (×2): qty 100

## 2021-09-12 MED ORDER — VALPROATE SODIUM 100 MG/ML IV SOLN
500.0000 mg | Freq: Three times a day (TID) | INTRAVENOUS | Status: DC
  Administered 2021-09-12 – 2021-09-13 (×6): 500 mg via INTRAVENOUS
  Filled 2021-09-12 (×9): qty 5

## 2021-09-12 MED ORDER — ADULT MULTIVITAMIN W/MINERALS CH
1.0000 | ORAL_TABLET | Freq: Every day | ORAL | Status: DC
  Administered 2021-09-14 – 2021-09-15 (×2): 1 via ORAL
  Filled 2021-09-12 (×3): qty 1

## 2021-09-12 MED ORDER — LORAZEPAM 1 MG PO TABS
1.0000 mg | ORAL_TABLET | ORAL | Status: DC | PRN
  Administered 2021-09-12: 2 mg via ORAL
  Filled 2021-09-12: qty 2

## 2021-09-12 MED ORDER — DIVALPROEX SODIUM 500 MG PO DR TAB: 500.0000 mg | DELAYED_RELEASE_TABLET | Freq: Three times a day (TID) | ORAL | Status: DC

## 2021-09-12 MED ORDER — SODIUM CHLORIDE 0.9 % IV BOLUS
500.0000 mL | Freq: Once | INTRAVENOUS | Status: AC
  Administered 2021-09-12: 500 mL via INTRAVENOUS

## 2021-09-12 MED ORDER — ENOXAPARIN SODIUM 40 MG/0.4ML IJ SOSY: 40.0000 mg | PREFILLED_SYRINGE | INTRAMUSCULAR | Status: DC

## 2021-09-12 MED ORDER — CLONIDINE HCL 0.1 MG PO TABS: 0.1000 mg | ORAL_TABLET | Freq: Every day | ORAL | Status: DC

## 2021-09-12 MED ORDER — POTASSIUM CHLORIDE IN NACL 20-0.9 MEQ/L-% IV SOLN
INTRAVENOUS | Status: DC
  Filled 2021-09-12 (×6): qty 1000

## 2021-09-12 MED ORDER — ORAL CARE MOUTH RINSE: 15.0000 mL | OROMUCOSAL | Status: DC | PRN

## 2021-09-12 MED ORDER — ACETAMINOPHEN 325 MG PO TABS
650.0000 mg | ORAL_TABLET | Freq: Four times a day (QID) | ORAL | Status: DC | PRN
  Administered 2021-09-12 – 2021-09-13 (×2): 650 mg via ORAL
  Filled 2021-09-12 (×2): qty 2

## 2021-09-12 MED ORDER — LORAZEPAM 2 MG/ML IJ SOLN
1.0000 mg | INTRAMUSCULAR | Status: DC | PRN
  Administered 2021-09-12 – 2021-09-13 (×5): 2 mg via INTRAVENOUS
  Filled 2021-09-12 (×5): qty 1

## 2021-09-12 MED ORDER — SODIUM CHLORIDE 0.9 % IV SOLN: INTRAVENOUS | Status: DC

## 2021-09-12 MED ORDER — CLONIDINE HCL 0.1 MG PO TABS
0.1000 mg | ORAL_TABLET | Freq: Four times a day (QID) | ORAL | Status: DC
  Administered 2021-09-12 (×3): 0.1 mg via ORAL
  Filled 2021-09-12 (×4): qty 1

## 2021-09-12 MED ORDER — ENOXAPARIN SODIUM 40 MG/0.4ML IJ SOSY
40.0000 mg | PREFILLED_SYRINGE | Freq: Every day | INTRAMUSCULAR | Status: DC
  Administered 2021-09-12 – 2021-09-14 (×3): 40 mg via SUBCUTANEOUS
  Filled 2021-09-12 (×4): qty 0.4

## 2021-09-12 MED ORDER — SODIUM CHLORIDE 0.9 % IV SOLN
3.0000 g | Freq: Four times a day (QID) | INTRAVENOUS | Status: DC
  Administered 2021-09-12 – 2021-09-14 (×11): 3 g via INTRAVENOUS
  Filled 2021-09-12 (×12): qty 8

## 2021-09-12 MED ORDER — THIAMINE HCL 100 MG/ML IJ SOLN
100.0000 mg | Freq: Every day | INTRAMUSCULAR | Status: DC
  Administered 2021-09-12 – 2021-09-13 (×2): 100 mg via INTRAVENOUS
  Filled 2021-09-12 (×2): qty 2
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
## 2021-09-12 NOTE — Progress Notes (Signed)
Pt on 15L NRB. Tried weaning down to HFNC 12L no tolerated; desat to 80's , re- applied NRB.

## 2021-09-13 DIAGNOSIS — J690 Pneumonitis due to inhalation of food and vomit: Secondary | ICD-10-CM | POA: Diagnosis not present

## 2021-09-13 DIAGNOSIS — F1193 Opioid use, unspecified with withdrawal: Secondary | ICD-10-CM | POA: Diagnosis not present

## 2021-09-13 DIAGNOSIS — R001 Bradycardia, unspecified: Secondary | ICD-10-CM | POA: Diagnosis not present

## 2021-09-13 LAB — CBC
HCT: 37.9 % — ABNORMAL LOW (ref 39.0–52.0)
Hemoglobin: 12 g/dL — ABNORMAL LOW (ref 13.0–17.0)
MCH: 28.9 pg (ref 26.0–34.0)
MCHC: 31.7 g/dL (ref 30.0–36.0)
MCV: 91.3 fL (ref 80.0–100.0)
Platelets: 177 10*3/uL (ref 150–400)
RBC: 4.15 MIL/uL — ABNORMAL LOW (ref 4.22–5.81)
RDW: 12.2 % (ref 11.5–15.5)
WBC: 10.9 10*3/uL — ABNORMAL HIGH (ref 4.0–10.5)
nRBC: 0 % (ref 0.0–0.2)

## 2021-09-13 LAB — BASIC METABOLIC PANEL
Anion gap: 8 (ref 5–15)
BUN: 10 mg/dL (ref 6–20)
CO2: 25 mmol/L (ref 22–32)
Calcium: 8.7 mg/dL — ABNORMAL LOW (ref 8.9–10.3)
Chloride: 114 mmol/L — ABNORMAL HIGH (ref 98–111)
Creatinine, Ser: 0.89 mg/dL (ref 0.61–1.24)
GFR, Estimated: >60 mL/min (ref >60)
Glucose, Bld: 100 mg/dL — ABNORMAL HIGH (ref 70–99)
Potassium: 3.8 mmol/L (ref 3.5–5.1)
Sodium: 147 mmol/L — ABNORMAL HIGH (ref 135–145)

## 2021-09-13 LAB — MAGNESIUM: Magnesium: 2.2 mg/dL (ref 1.7–2.4)

## 2021-09-13 LAB — TSH: TSH: 0.66 u[IU]/mL (ref 0.350–4.500)

## 2021-09-13 LAB — VALPROIC ACID LEVEL: Valproic Acid Lvl: 53 ug/mL (ref 50.0–100.0)

## 2021-09-13 MED ORDER — LORAZEPAM 2 MG/ML IJ SOLN
1.0000 mg | Freq: Once | INTRAMUSCULAR | Status: AC
  Administered 2021-09-13: 1 mg via INTRAVENOUS
  Filled 2021-09-13: qty 1

## 2021-09-13 MED ORDER — BUPRENORPHINE HCL-NALOXONE HCL 8-2 MG SL SUBL
1.0000 | SUBLINGUAL_TABLET | Freq: Two times a day (BID) | SUBLINGUAL | Status: DC
  Administered 2021-09-14 – 2021-09-15 (×3): 1 via SUBLINGUAL
  Filled 2021-09-13 (×3): qty 1

## 2021-09-13 MED ORDER — BUPRENORPHINE HCL-NALOXONE HCL 2-0.5 MG SL SUBL
2.0000 | SUBLINGUAL_TABLET | SUBLINGUAL | Status: AC | PRN
  Administered 2021-09-13 (×2): 2 via SUBLINGUAL
  Filled 2021-09-13 (×3): qty 2

## 2021-09-13 MED ORDER — VALPROATE SODIUM 100 MG/ML IV SOLN
500.0000 mg | Freq: Once | INTRAVENOUS | Status: DC
  Filled 2021-09-13: qty 5

## 2021-09-13 MED ORDER — LORAZEPAM 2 MG/ML IJ SOLN
1.0000 mg | Freq: Four times a day (QID) | INTRAMUSCULAR | Status: DC | PRN
  Administered 2021-09-13 – 2021-09-14 (×3): 1 mg via INTRAVENOUS
  Filled 2021-09-13 (×3): qty 1

## 2021-09-13 MED ORDER — SODIUM CHLORIDE 0.9 % IV SOLN
100.0000 mg | Freq: Two times a day (BID) | INTRAVENOUS | Status: DC
  Administered 2021-09-13 – 2021-09-14 (×3): 100 mg via INTRAVENOUS
  Filled 2021-09-13 (×4): qty 10

## 2021-09-14 ENCOUNTER — Encounter (HOSPITAL_COMMUNITY): Payer: Self-pay | Admitting: Radiology

## 2021-09-14 ENCOUNTER — Other Ambulatory Visit: Payer: Self-pay

## 2021-09-14 ENCOUNTER — Inpatient Hospital Stay (HOSPITAL_COMMUNITY)
Admit: 2021-09-14 | Discharge: 2021-09-14 | Disposition: A | Discharge status: Home / Self Care | Payer: Medicaid Other | Attending: Internal Medicine | Admitting: Internal Medicine

## 2021-09-14 ENCOUNTER — Inpatient Hospital Stay (HOSPITAL_COMMUNITY): Payer: Medicaid Other

## 2021-09-14 DIAGNOSIS — R569 Unspecified convulsions: Secondary | ICD-10-CM

## 2021-09-14 DIAGNOSIS — G249 Dystonia, unspecified: Secondary | ICD-10-CM

## 2021-09-14 DIAGNOSIS — R001 Bradycardia, unspecified: Secondary | ICD-10-CM | POA: Diagnosis not present

## 2021-09-14 DIAGNOSIS — F1193 Opioid use, unspecified with withdrawal: Secondary | ICD-10-CM | POA: Diagnosis not present

## 2021-09-14 DIAGNOSIS — J690 Pneumonitis due to inhalation of food and vomit: Secondary | ICD-10-CM | POA: Diagnosis not present

## 2021-09-14 LAB — BASIC METABOLIC PANEL
Anion gap: 14 (ref 5–15)
Anion gap: 9 (ref 5–15)
BUN: 8 mg/dL (ref 6–20)
BUN: 8 mg/dL (ref 6–20)
CO2: 21 mmol/L — ABNORMAL LOW (ref 22–32)
CO2: 23 mmol/L (ref 22–32)
Calcium: 8.3 mg/dL — ABNORMAL LOW (ref 8.9–10.3)
Calcium: 8.8 mg/dL — ABNORMAL LOW (ref 8.9–10.3)
Chloride: 108 mmol/L (ref 98–111)
Chloride: 109 mmol/L (ref 98–111)
Creatinine, Ser: 0.66 mg/dL (ref 0.61–1.24)
Creatinine, Ser: 0.9 mg/dL (ref 0.61–1.24)
GFR, Estimated: >60 mL/min (ref >60)
GFR, Estimated: >60 mL/min (ref >60)
Glucose, Bld: 95 mg/dL (ref 70–99)
Glucose, Bld: 98 mg/dL (ref 70–99)
Potassium: 3.1 mmol/L — ABNORMAL LOW (ref 3.5–5.1)
Potassium: 3.2 mmol/L — ABNORMAL LOW (ref 3.5–5.1)
Sodium: 141 mmol/L (ref 135–145)
Sodium: 143 mmol/L (ref 135–145)

## 2021-09-14 LAB — PHOSPHORUS: Phosphorus: 2.1 mg/dL — ABNORMAL LOW (ref 2.5–4.6)

## 2021-09-14 LAB — CBC
HCT: 34.1 % — ABNORMAL LOW (ref 39.0–52.0)
Hemoglobin: 11.3 g/dL — ABNORMAL LOW (ref 13.0–17.0)
MCH: 29 pg (ref 26.0–34.0)
MCHC: 33.1 g/dL (ref 30.0–36.0)
MCV: 87.7 fL (ref 80.0–100.0)
Platelets: 162 10*3/uL (ref 150–400)
RBC: 3.89 MIL/uL — ABNORMAL LOW (ref 4.22–5.81)
RDW: 11.9 % (ref 11.5–15.5)
WBC: 10.9 10*3/uL — ABNORMAL HIGH (ref 4.0–10.5)
nRBC: 0 % (ref 0.0–0.2)

## 2021-09-14 LAB — MAGNESIUM: Magnesium: 2.3 mg/dL (ref 1.7–2.4)

## 2021-09-14 LAB — LACTIC ACID, PLASMA
Lactic Acid, Venous: 4.5 mmol/L (ref 0.5–1.9)
Lactic Acid, Venous: 1 mmol/L (ref 0.5–1.9)

## 2021-09-14 LAB — VALPROIC ACID LEVEL: Valproic Acid Lvl: 59 ug/mL (ref 50.0–100.0)

## 2021-09-14 MED ORDER — AMOXICILLIN-POT CLAVULANATE 875-125 MG PO TABS
1.0000 | ORAL_TABLET | Freq: Two times a day (BID) | ORAL | Status: DC
Start: 2021-09-14 — End: 2021-09-15
  Administered 2021-09-14 – 2021-09-15 (×2): 1 via ORAL
  Filled 2021-09-14 (×2): qty 1

## 2021-09-14 MED ORDER — POTASSIUM CHLORIDE CRYS ER 20 MEQ PO TBCR
40.0000 meq | EXTENDED_RELEASE_TABLET | Freq: Three times a day (TID) | ORAL | Status: AC
  Administered 2021-09-14 (×2): 40 meq via ORAL
  Filled 2021-09-14 (×2): qty 2

## 2021-09-14 MED ORDER — SODIUM CHLORIDE 0.9 % IV BOLUS
1000.0000 mL | Freq: Once | INTRAVENOUS | Status: AC
  Administered 2021-09-14: 1000 mL via INTRAVENOUS

## 2021-09-14 NOTE — Progress Notes (Signed)
EEG completed pending results

## 2021-09-14 NOTE — Progress Notes (Signed)
Patient transferred 1505 in NAD. VSS on RA. MIVF running. Patient attached to tele. Oriented to unit and call bell. Family at bedside.

## 2021-09-15 DIAGNOSIS — J690 Pneumonitis due to inhalation of food and vomit: Secondary | ICD-10-CM | POA: Diagnosis not present

## 2021-09-15 LAB — BASIC METABOLIC PANEL
Anion gap: 10 (ref 5–15)
BUN: 8 mg/dL (ref 6–20)
CO2: 24 mmol/L (ref 22–32)
Calcium: 9 mg/dL (ref 8.9–10.3)
Chloride: 105 mmol/L (ref 98–111)
Creatinine, Ser: 0.71 mg/dL (ref 0.61–1.24)
GFR, Estimated: >60 mL/min (ref >60)
Glucose, Bld: 90 mg/dL (ref 70–99)
Potassium: 3.7 mmol/L (ref 3.5–5.1)
Sodium: 139 mmol/L (ref 135–145)

## 2021-09-15 LAB — CBC
HCT: 39.7 % (ref 39.0–52.0)
Hemoglobin: 13.2 g/dL (ref 13.0–17.0)
MCH: 28.7 pg (ref 26.0–34.0)
MCHC: 33.2 g/dL (ref 30.0–36.0)
MCV: 86.3 fL (ref 80.0–100.0)
Platelets: 196 10*3/uL (ref 150–400)
RBC: 4.6 MIL/uL (ref 4.22–5.81)
RDW: 11.8 % (ref 11.5–15.5)
WBC: 7.4 10*3/uL (ref 4.0–10.5)
nRBC: 0 % (ref 0.0–0.2)

## 2021-09-15 LAB — MAGNESIUM: Magnesium: 2.3 mg/dL (ref 1.7–2.4)

## 2021-09-15 MED ORDER — LACOSAMIDE 100 MG PO TABS: 100.0000 mg | ORAL_TABLET | Freq: Two times a day (BID) | ORAL | 2 refills | Status: AC

## 2021-09-15 MED ORDER — AMOXICILLIN-POT CLAVULANATE 875-125 MG PO TABS: 1.0000 | ORAL_TABLET | Freq: Two times a day (BID) | ORAL | 0 refills | Status: AC

## 2021-09-15 MED ORDER — LACOSAMIDE 50 MG PO TABS
100.0000 mg | ORAL_TABLET | Freq: Two times a day (BID) | ORAL | Status: DC
  Administered 2021-09-15: 100 mg via ORAL
  Filled 2021-09-15: qty 2

## 2021-09-17 LAB — CULTURE, BLOOD (ROUTINE X 2)
Culture: NO GROWTH
Culture: NO GROWTH
Special Requests: ADEQUATE
Special Requests: ADEQUATE

## 2021-09-23 LAB — DRUG SCREEN 10 W/CONF, SERUM
Amphetamines, IA: NEGATIVE ng/mL
Barbiturates, IA: NEGATIVE ug/mL
Benzodiazepines, IA: NEGATIVE ng/mL
Cocaine & Metabolite, IA: NEGATIVE ng/mL
Methadone, IA: NEGATIVE ng/mL
Opiates, IA: NEGATIVE ng/mL
Oxycodones, IA: NEGATIVE ng/mL
Phencyclidine, IA: NEGATIVE ng/mL
Propoxyphene, IA: NEGATIVE ng/mL
THC(Marijuana) Metabolite, IA: POSITIVE ng/mL — AB

## 2021-09-23 LAB — THC,MS,WB/SP RFX
Cannabidiol: NEGATIVE ng/mL
Cannabinoid Confirmation: POSITIVE
Carboxy-THC: 39.8 ng/mL
Hydroxy-THC: NEGATIVE ng/mL
Tetrahydrocannabinol(THC): NEGATIVE ng/mL

## 2021-12-24 IMAGING — CT CT HEAD W/O CM
3 series · 15 of 47 positions shown, 18 images · non-contrast
Comparison: MRI brain and CT head 03/10/2020.

CLINICAL DATA: Mental status change.  Seizure.

EXAM:
CT HEAD WITHOUT CONTRAST
TECHNIQUE: Contiguous axial images were obtained from the base of the skull
through the vertex without intravenous contrast.

[Series 2: head wo · axial · 0.47mm/px · z∈[-147,-22]mm · 9 of 30 slices shown, 12 images]
[im 3/30  brain]
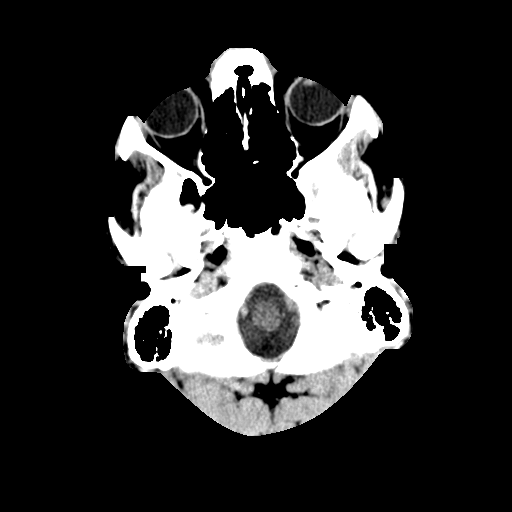
[im 3/30  bone]
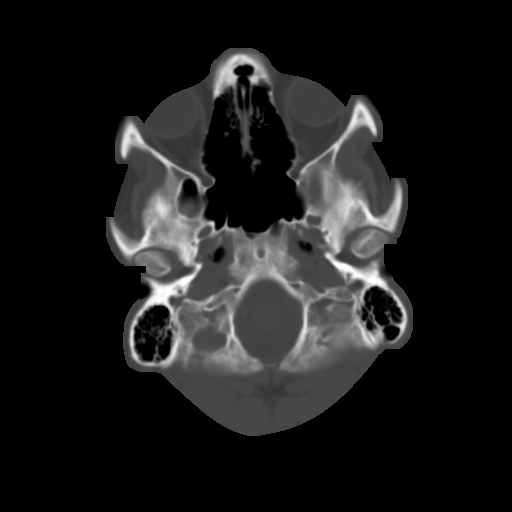
[im 6/30  brain]
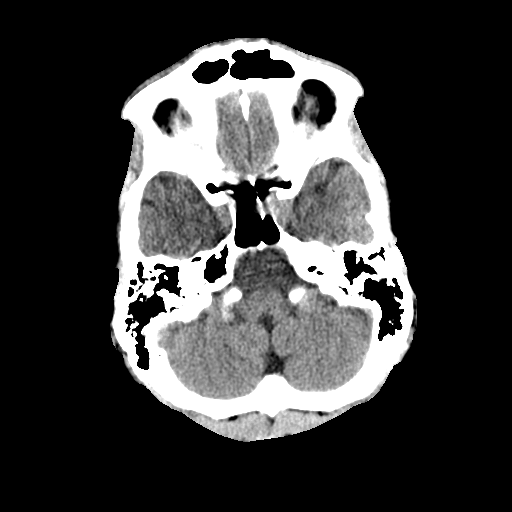
[im 9/30  brain]
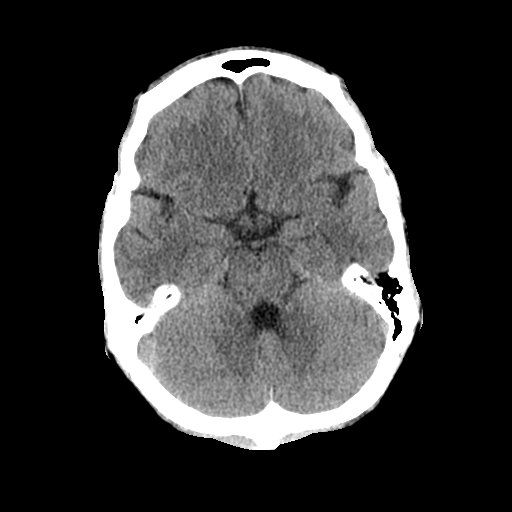
[im 12/30  brain]
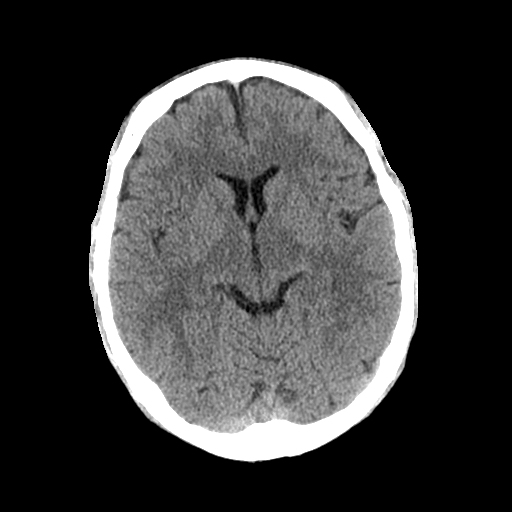
[im 16/30  brain]
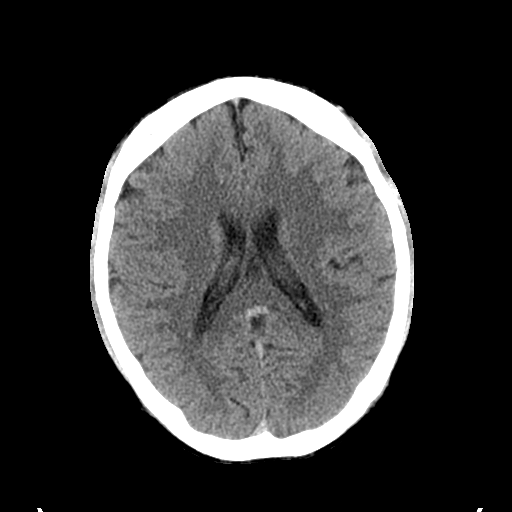
[im 16/30  bone]
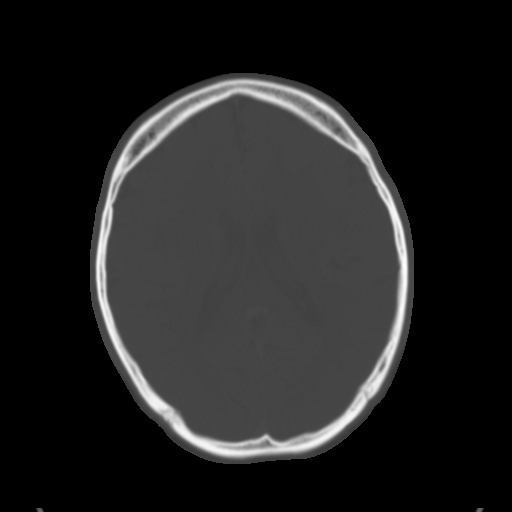
[im 19/30  brain]
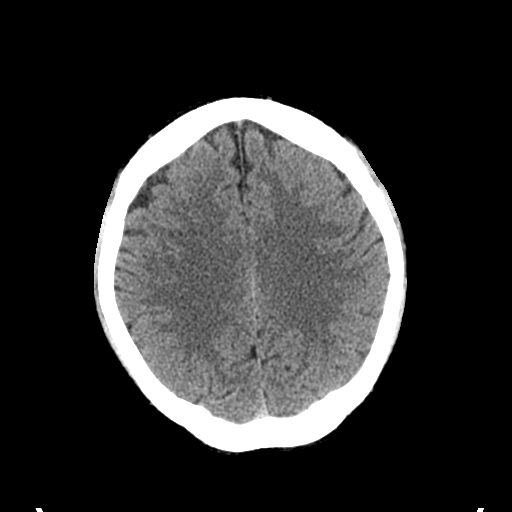
[im 22/30  brain]
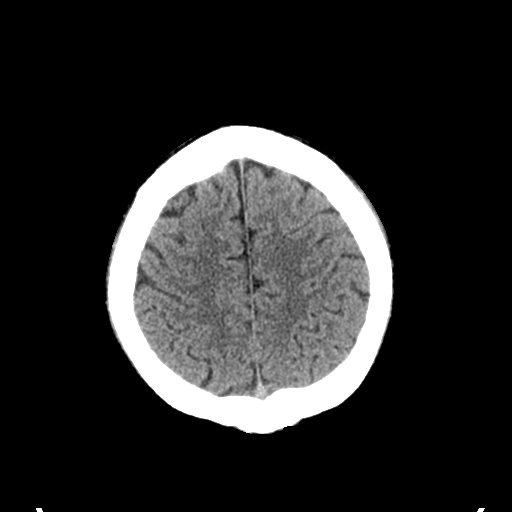
[im 25/30  brain]
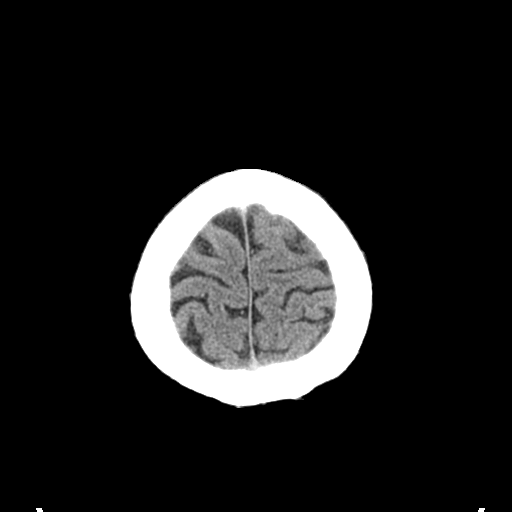
[im 28/30  brain]
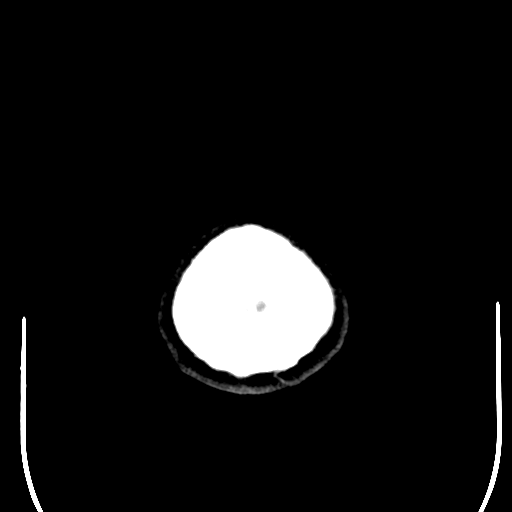
[im 28/30  bone]
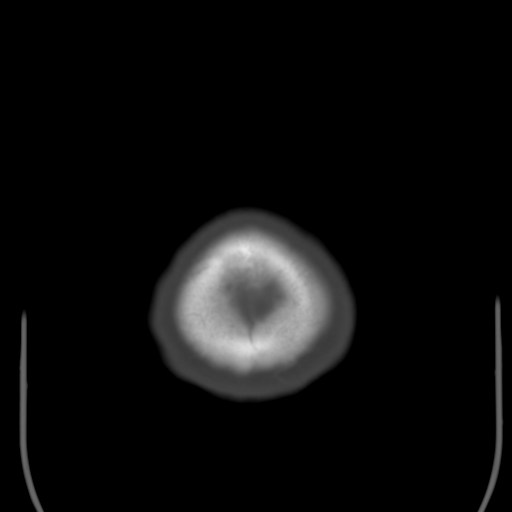

[Series 4: coronal soft tissue · coronal · 0.31mm/px · 3 of 76 slices shown]
[im 26/76  brain]
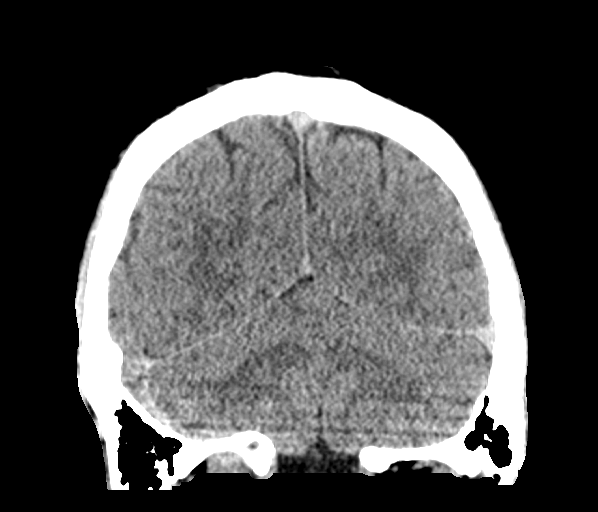
[im 34/76  brain]
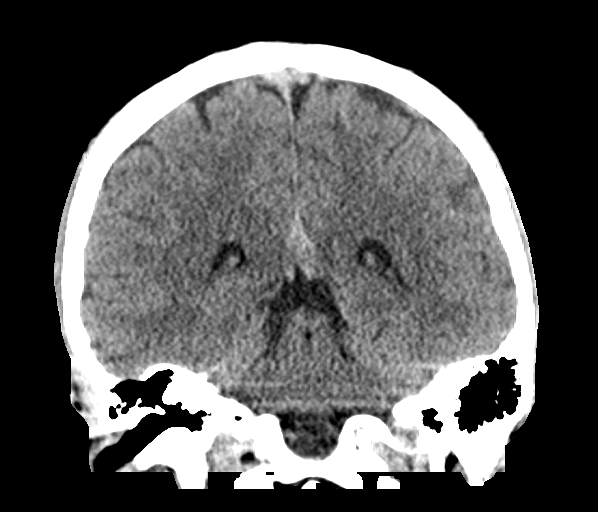
[im 42/76  brain]
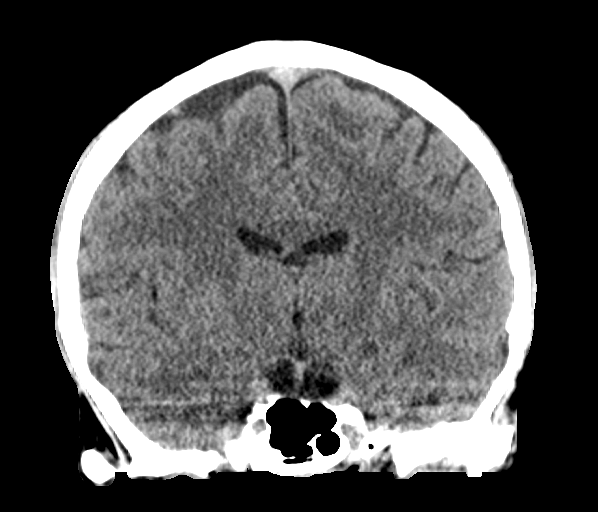

[Series 5: sagittal soft tissue · sagittal · 0.31mm/px · 3 of 62 slices shown]
[im 21/62  brain]
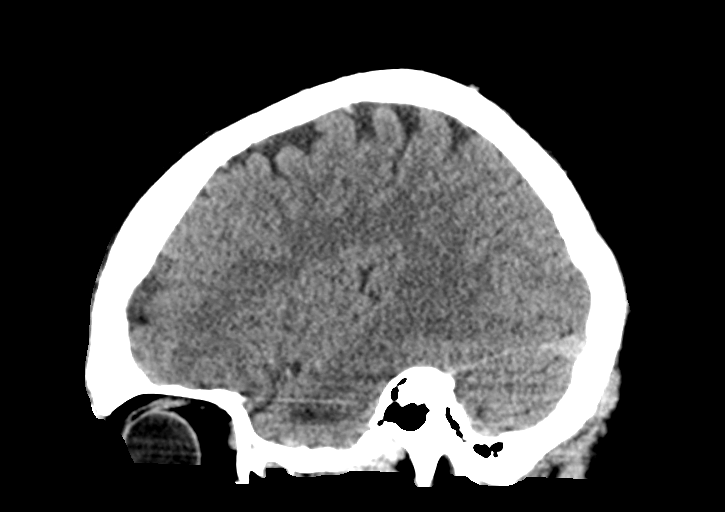
[im 31/62  brain]
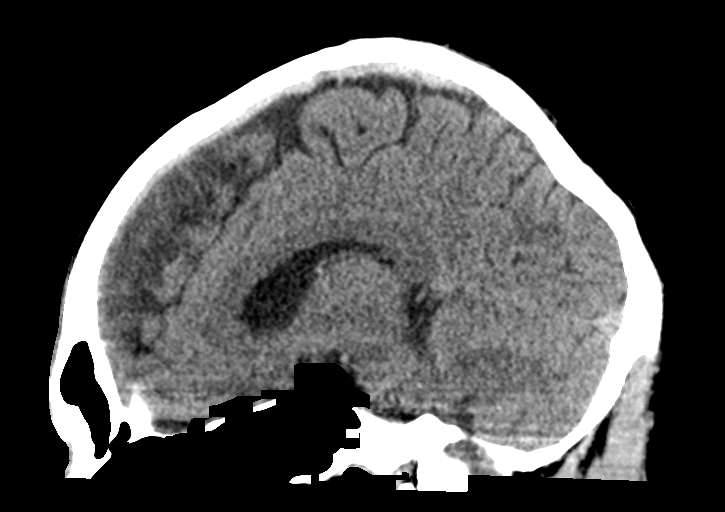
[im 41/62  brain]
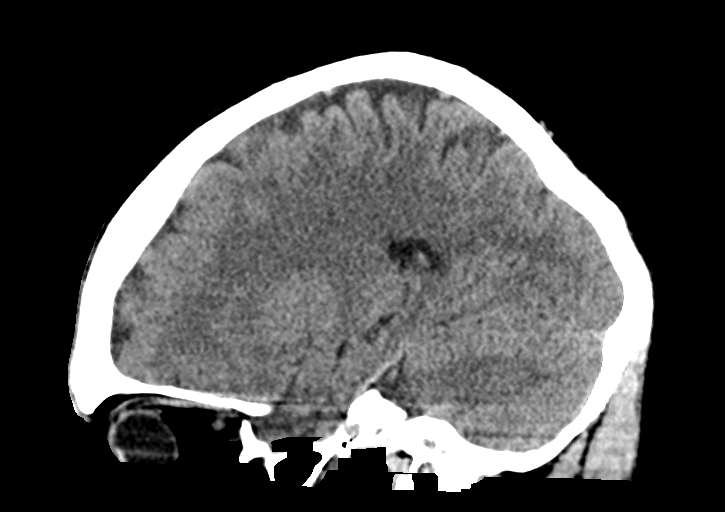

[15 of 47 positions shown; findings below may reference images not displayed]

FINDINGS: Brain: No evidence of acute infarction, hemorrhage, hydrocephalus,
extra-axial collection or mass lesion/mass effect.

Vascular: No hyperdense vessel or unexpected calcification.

Skull: Normal. Negative for fracture or focal lesion.

Sinuses/Orbits: No acute finding.

Other: None.
IMPRESSION: No acute intracranial abnormality.
